# Patient Record
Sex: Female | Born: 1985 | Race: Black or African American | Hispanic: No | Marital: Single | Smoking: Never smoker
Health system: Southern US, Community
[De-identification: ages and names within clinical notes are randomized; demographics above are authoritative.]

---

## 2017-01-30 ENCOUNTER — Encounter (HOSPITAL_BASED_OUTPATIENT_CLINIC_OR_DEPARTMENT_OTHER): Payer: Self-pay | Admitting: Emergency Medicine

## 2017-01-30 ENCOUNTER — Emergency Department (HOSPITAL_BASED_OUTPATIENT_CLINIC_OR_DEPARTMENT_OTHER)
Admission: EM | Admit: 2017-01-30 | Discharge: 2017-01-30 | Disposition: A | Discharge status: Home / Self Care | Payer: 59 | Attending: Emergency Medicine | Admitting: Emergency Medicine

## 2017-01-30 DIAGNOSIS — H9201 Otalgia, right ear: Secondary | ICD-10-CM | POA: Diagnosis not present

## 2017-01-30 DIAGNOSIS — J36 Peritonsillar abscess: Secondary | ICD-10-CM | POA: Diagnosis not present

## 2017-01-30 DIAGNOSIS — R070 Pain in throat: Secondary | ICD-10-CM | POA: Diagnosis present

## 2017-01-30 DIAGNOSIS — J029 Acute pharyngitis, unspecified: Secondary | ICD-10-CM | POA: Diagnosis not present

## 2017-01-30 DIAGNOSIS — J028 Acute pharyngitis due to other specified organisms: Secondary | ICD-10-CM

## 2017-01-30 LAB — RAPID STREP SCREEN (MED CTR MEBANE ONLY): STREPTOCOCCUS, GROUP A SCREEN (DIRECT): POSITIVE — AB

## 2017-01-30 MED ORDER — IBUPROFEN 400 MG PO TABS
400.0000 mg | ORAL_TABLET | Freq: Once | ORAL | Status: AC
  Administered 2017-01-30: 400 mg via ORAL
  Filled 2017-01-30: qty 1

## 2017-01-30 MED ORDER — AMOXICILLIN 500 MG PO CAPS: 1000.0000 mg | ORAL_CAPSULE | Freq: Three times a day (TID) | ORAL | 0 refills | Status: DC

## 2017-01-30 NOTE — ED Provider Notes (Signed)
[facility] EMERGENCY DEPARTMENT Provider Note   CSN: [id number] Arrival date & time: 01/30/17  0751     History   Chief Complaint Chief Complaint  Patient presents with  . Sore Throat  . Otalgia    HPI Jenna Hickman is a 32 y.o. female.  Patient c/o throat pain for the past week. Gradual onset, constant, moderate, slowly worsening. No trouble breathing or swallowing. Pain is worse on right, and w associated right ear pain. No decreased hearing. No drainage from ear. No headache. No neck pain or stiffness. Subjective fever. No runny nose. No cough or chest pain. No known ill contacts.    The history is provided by the patient.  Sore Throat  Pertinent negatives include no headaches and no shortness of breath.  Otalgia  Associated symptoms include sore throat. Pertinent negatives include no headaches, no rhinorrhea, no diarrhea, no vomiting, no neck pain and no rash.    History reviewed. No pertinent past medical history.  There are no active problems to display for this patient.   History reviewed. No pertinent surgical history.  OB History    No data available       Home Medications    Prior to Admission medications   Medication Sig Start Date End Date Taking? Authorizing Provider  amoxicillin (AMOXIL) 500 MG capsule Take 2 capsules (1,000 mg total) by mouth 3 (three) times daily. 01/30/17   Cathren LaineSteinl, Kellene Mccleary, MD    Family History No family history on file.  Social History Social History   Tobacco Use  . Smoking status: Not on file  Substance Use Topics  . Alcohol use: Not on file  . Drug use: Not on file     Allergies   No known allergies   Review of Systems Review of Systems  Constitutional: Positive for fever.  HENT: Positive for ear pain and sore throat. Negative for rhinorrhea.   Respiratory: Negative for shortness of breath.   Gastrointestinal: Negative for diarrhea and vomiting.  Musculoskeletal: Negative for neck pain and neck  stiffness.  Skin: Negative for rash.  Neurological: Negative for headaches.     Physical Exam Updated Vital Signs BP 127/89   Pulse 95   Temp 99.2 F (37.3 C) (Oral)   Resp 20   Ht 1.727 m (5\' 8" )   Wt 108.9 kg (240 lb)   SpO2 99%   BMI 36.49 kg/m   Physical Exam  Constitutional: She appears well-developed and well-nourished. No distress.  HENT:  Pharynx erythematous. Mild increased swelling on right. +exudate. No swelling or pain to floor of mouth or neck.   Eyes: Conjunctivae are normal. No scleral icterus.  Neck: Neck supple. No tracheal deviation present.  Anterior cervical l/a. No stiffness or rigidity  Cardiovascular: Normal rate, regular rhythm, normal heart sounds and intact distal pulses.  No murmur heard. Pulmonary/Chest: Effort normal. No stridor. No respiratory distress.  Abdominal: Normal appearance. She exhibits no distension.  No hsm.   Musculoskeletal: She exhibits no edema.  Lymphadenopathy:    She has cervical adenopathy.  Neurological: She is alert.  Skin: Skin is warm and dry. No rash noted. She is not diaphoretic.  Psychiatric: She has a normal mood and affect.  Nursing note and vitals reviewed.    ED Treatments / Results  Labs (all labs ordered are listed, but only abnormal results are displayed) Labs Reviewed  RAPID STREP SCREEN (NOT AT [facility]) - Abnormal; Notable for the following components:      Result Value  Streptococcus, Group A Screen (Direct) POSITIVE (*)    All other components within normal limits    EKG  EKG Interpretation None       Radiology No results found.  Procedures Procedures (including critical care time)  Medications Ordered in ED Medications  ibuprofen (ADVIL,MOTRIN) tablet 400 mg (not administered)     Initial Impression / Assessment and Plan / ED Course  I have reviewed the triage vital signs and the nursing notes.  Pertinent labs & imaging results that were available during my care of the patient  were reviewed by me and considered in my medical decision making (see chart for details).  Confirmed nkda w pt.   rx for home.  Instructed if increased right side pain/swelling despite abx rx, to follow up closely with ENT (?possible v early right pta)  Return precautions provided.     Final Clinical Impressions(s) / ED Diagnoses   Final diagnoses:  Pharyngitis due to other organism    ED Discharge Orders        Ordered    amoxicillin (AMOXIL) 500 MG capsule  3 times daily     01/30/17 9604       Cathren Laine, MD 01/30/17 [phone]

## 2017-01-30 NOTE — ED Triage Notes (Signed)
Pt states she had a cold and got over that but her throat has been hurting for 1.5 weeks and then her right ear started hurting approx 3 days ago. No recent fevers. Pt is able to swallow but just hurts

## 2017-01-30 NOTE — Discharge Instructions (Signed)
It was our pleasure to provide your ER care today - we hope that you feel better.  Take antibiotic as prescribed.  Take acetaminophen and/or ibuprofen as need for pain.  Use throat lozenges as need for symptom relief.  Follow up with ENT doctor in the next couple days if worsening right sided throat pain and swelling - see referral - call office to arrange appointment.   Return to ER if worse, unable to swallow, trouble breathing, other concern.

## 2017-01-30 NOTE — ED Notes (Signed)
Pt understood dc material. NAD noted. Script and work excuse given at dc 

## 2017-02-20 ENCOUNTER — Emergency Department (HOSPITAL_BASED_OUTPATIENT_CLINIC_OR_DEPARTMENT_OTHER)
Admission: EM | Admit: 2017-02-20 | Discharge: 2017-02-20 | Disposition: A | Discharge status: Home / Self Care | Payer: 59 | Attending: Emergency Medicine | Admitting: Emergency Medicine

## 2017-02-20 ENCOUNTER — Encounter (HOSPITAL_BASED_OUTPATIENT_CLINIC_OR_DEPARTMENT_OTHER): Payer: Self-pay | Admitting: *Deleted

## 2017-02-20 ENCOUNTER — Other Ambulatory Visit: Payer: Self-pay

## 2017-02-20 DIAGNOSIS — J111 Influenza due to unidentified influenza virus with other respiratory manifestations: Secondary | ICD-10-CM | POA: Insufficient documentation

## 2017-02-20 DIAGNOSIS — R509 Fever, unspecified: Secondary | ICD-10-CM | POA: Diagnosis present

## 2017-02-20 LAB — RAPID STREP SCREEN (MED CTR MEBANE ONLY): STREPTOCOCCUS, GROUP A SCREEN (DIRECT): NEGATIVE

## 2017-02-20 MED ORDER — OSELTAMIVIR PHOSPHATE 75 MG PO CAPS
75.0000 mg | ORAL_CAPSULE | Freq: Once | ORAL | Status: AC
  Administered 2017-02-20: 75 mg via ORAL
  Filled 2017-02-20: qty 1

## 2017-02-20 MED ORDER — OSELTAMIVIR PHOSPHATE 75 MG PO CAPS: 75.0000 mg | ORAL_CAPSULE | Freq: Two times a day (BID) | ORAL | 0 refills | Status: DC

## 2017-02-20 MED ORDER — ACETAMINOPHEN 500 MG PO TABS
1000.0000 mg | ORAL_TABLET | Freq: Once | ORAL | Status: AC
Start: 2017-02-20 — End: 2017-02-20
  Administered 2017-02-20: 1000 mg via ORAL
  Filled 2017-02-20: qty 2

## 2017-02-20 NOTE — ED Provider Notes (Signed)
[facility] EMERGENCY DEPARTMENT Provider Note   CSN: [id number] Arrival date & time: 02/20/17  1839     History   Chief Complaint Chief Complaint  Patient presents with  . Fever    HPI Jenna Hickman is a 32 y.o. female.  The history is provided by the patient.  Influenza  Presenting symptoms: cough, fatigue, fever and myalgias   Presenting symptoms: no nausea, no shortness of breath, no sore throat and no vomiting   Severity:  Severe Onset quality:  Gradual Duration:  1 day Progression:  Worsening Chronicity:  New Relieved by:  Nothing Worsened by:  Nothing Ineffective treatments:  None tried Associated symptoms: chills, decreased appetite and nasal congestion   Associated symptoms: no neck stiffness   Risk factors: sick contacts   Risk factors comment:  Works in [employer] and multiple kids out with flu   History reviewed. No pertinent past medical history.  There are no active problems to display for this patient.   History reviewed. No pertinent surgical history.  OB History    No data available       Home Medications    Prior to Admission medications   Medication Sig Start Date End Date Taking? Authorizing Provider  amoxicillin (AMOXIL) 500 MG capsule Take 2 capsules (1,000 mg total) by mouth 3 (three) times daily. 01/30/17   Cathren Laine, MD    Family History No family history on file.  Social History Social History   Tobacco Use  . Smoking status: Never Smoker  . Smokeless tobacco: Never Used  Substance Use Topics  . Alcohol use: Not on file  . Drug use: Not on file     Allergies   No known allergies   Review of Systems Review of Systems  Constitutional: Positive for chills, decreased appetite, fatigue and fever.  HENT: Positive for congestion. Negative for sore throat.   Respiratory: Positive for cough. Negative for shortness of breath.   Gastrointestinal: Negative for nausea and vomiting.  Musculoskeletal: Positive  for myalgias. Negative for neck stiffness.  All other systems reviewed and are negative.    Physical Exam Updated Vital Signs BP 140/83 (BP Location: Right Arm)   Pulse (!) 118   Resp 20   Ht 5\' 8"  (1.727 m)   Wt 108.9 kg (240 lb)   LMP 02/01/2017   SpO2 96%   BMI 36.49 kg/m   Physical Exam  Constitutional: She is oriented to person, place, and time. She appears well-developed and well-nourished. No distress.  HENT:  Head: Normocephalic and atraumatic.  Right Ear: A middle ear effusion is present.  Left Ear: A middle ear effusion is present.  Nose: Mucosal edema and rhinorrhea present.  Mouth/Throat: Posterior oropharyngeal erythema present. No oropharyngeal exudate or posterior oropharyngeal edema.  Eyes: Conjunctivae and EOM are normal. Pupils are equal, round, and reactive to light.  Neck: Normal range of motion. Neck supple.  Cardiovascular: Regular rhythm and intact distal pulses. Tachycardia present.  No murmur heard. Pulmonary/Chest: Effort normal and breath sounds normal. No respiratory distress. She has no wheezes. She has no rales.  Abdominal: Soft. She exhibits no distension. There is no tenderness. There is no rebound and no guarding.  Musculoskeletal: Normal range of motion. She exhibits no edema or tenderness.  Neurological: She is alert and oriented to person, place, and time.  Skin: Skin is warm and dry. No rash noted. No erythema.  Psychiatric: She has a normal mood and affect. Her behavior is normal.  Nursing note  and vitals reviewed.    ED Treatments / Results  Labs (all labs ordered are listed, but only abnormal results are displayed) Labs Reviewed  RAPID STREP SCREEN (NOT AT [facility])  CULTURE, GROUP A STREP [facility]    EKG  EKG Interpretation None       Radiology No results found.  Procedures Procedures (including critical care time)  Medications Ordered in ED Medications  acetaminophen (TYLENOL) tablet 1,000 mg (not administered)    oseltamivir (TAMIFLU) capsule 75 mg (not administered)     Initial Impression / Assessment and Plan / ED Course  I have reviewed the triage vital signs and the nursing notes.  Pertinent labs & imaging results that were available during my care of the patient were reviewed by me and considered in my medical decision making (see chart for details).     Pt with symptoms consistent with influenza.  Normal exam here but is febrile.  No signs of breathing difficulty  No signs of strep pharyngitis, otitis or abnormal abdominal findings.   rapid strep wnl.  Will continue antipyretica, tamiflu and rest and fluids and return for any further problems.   Final Clinical Impressions(s) / ED Diagnoses   Final diagnoses:  Influenza    ED Discharge Orders        Ordered    oseltamivir (TAMIFLU) 75 MG capsule  Every 12 hours     02/20/17 2029       Gwyneth SproutPlunkett, Zayne Marovich, MD 02/20/17 2032

## 2017-02-20 NOTE — ED Triage Notes (Addendum)
Fever since yesterday. States she can control the fever with Tylenol and Motrin. She has not taken Motrin since this am. Body aches. She was treated for a positive strep screen 3 weeks ago.

## 2017-02-23 LAB — CULTURE, GROUP A STREP (THRC)

## 2018-04-07 DIAGNOSIS — Z803 Family history of malignant neoplasm of breast: Secondary | ICD-10-CM | POA: Insufficient documentation

## 2018-04-07 DIAGNOSIS — Z86711 Personal history of pulmonary embolism: Secondary | ICD-10-CM | POA: Insufficient documentation

## 2018-07-08 DIAGNOSIS — Z86718 Personal history of other venous thrombosis and embolism: Secondary | ICD-10-CM | POA: Insufficient documentation

## 2018-07-08 DIAGNOSIS — E6609 Other obesity due to excess calories: Secondary | ICD-10-CM | POA: Insufficient documentation

## 2020-04-12 ENCOUNTER — Encounter (HOSPITAL_BASED_OUTPATIENT_CLINIC_OR_DEPARTMENT_OTHER): Payer: Self-pay

## 2020-04-12 ENCOUNTER — Emergency Department (HOSPITAL_BASED_OUTPATIENT_CLINIC_OR_DEPARTMENT_OTHER)
Admission: EM | Admit: 2020-04-12 | Discharge: 2020-04-12 | Disposition: A | Discharge status: Home / Self Care | Payer: Self-pay | Attending: Emergency Medicine | Admitting: Emergency Medicine

## 2020-04-12 ENCOUNTER — Other Ambulatory Visit: Payer: Self-pay

## 2020-04-12 ENCOUNTER — Emergency Department (HOSPITAL_BASED_OUTPATIENT_CLINIC_OR_DEPARTMENT_OTHER): Payer: Self-pay

## 2020-04-12 DIAGNOSIS — K219 Gastro-esophageal reflux disease without esophagitis: Secondary | ICD-10-CM | POA: Insufficient documentation

## 2020-04-12 LAB — BASIC METABOLIC PANEL
Anion gap: 8 (ref 5–15)
BUN: 9 mg/dL (ref 6–20)
CO2: 26 mmol/L (ref 22–32)
Calcium: 8.9 mg/dL (ref 8.9–10.3)
Chloride: 102 mmol/L (ref 98–111)
Creatinine, Ser: 0.87 mg/dL (ref 0.44–1.00)
GFR, Estimated: >60 mL/min (ref >60)
Glucose, Bld: 93 mg/dL (ref 70–99)
Potassium: 3.8 mmol/L (ref 3.5–5.1)
Sodium: 136 mmol/L (ref 135–145)

## 2020-04-12 LAB — CBC
HCT: 39.8 % (ref 36.0–46.0)
Hemoglobin: 13.2 g/dL (ref 12.0–15.0)
MCH: 29.5 pg (ref 26.0–34.0)
MCHC: 33.2 g/dL (ref 30.0–36.0)
MCV: 89 fL (ref 80.0–100.0)
Platelets: 278 10*3/uL (ref 150–400)
RBC: 4.47 MIL/uL (ref 3.87–5.11)
RDW: 12.9 % (ref 11.5–15.5)
WBC: 5.1 10*3/uL (ref 4.0–10.5)
nRBC: 0 % (ref 0.0–0.2)

## 2020-04-12 LAB — TROPONIN I (HIGH SENSITIVITY): Troponin I (High Sensitivity): 3 ng/L (ref <18)

## 2020-04-12 LAB — PREGNANCY, URINE: Preg Test, Ur: NEGATIVE

## 2020-04-12 IMAGING — DX DG CHEST 2V
2 series · 2 of 2 positions shown · non-contrast
Comparison: Report from chest CT [DATE], images not available.

CLINICAL DATA: Chest pain for 4 days.

EXAM:
CHEST - 2 VIEW

[chest pa]
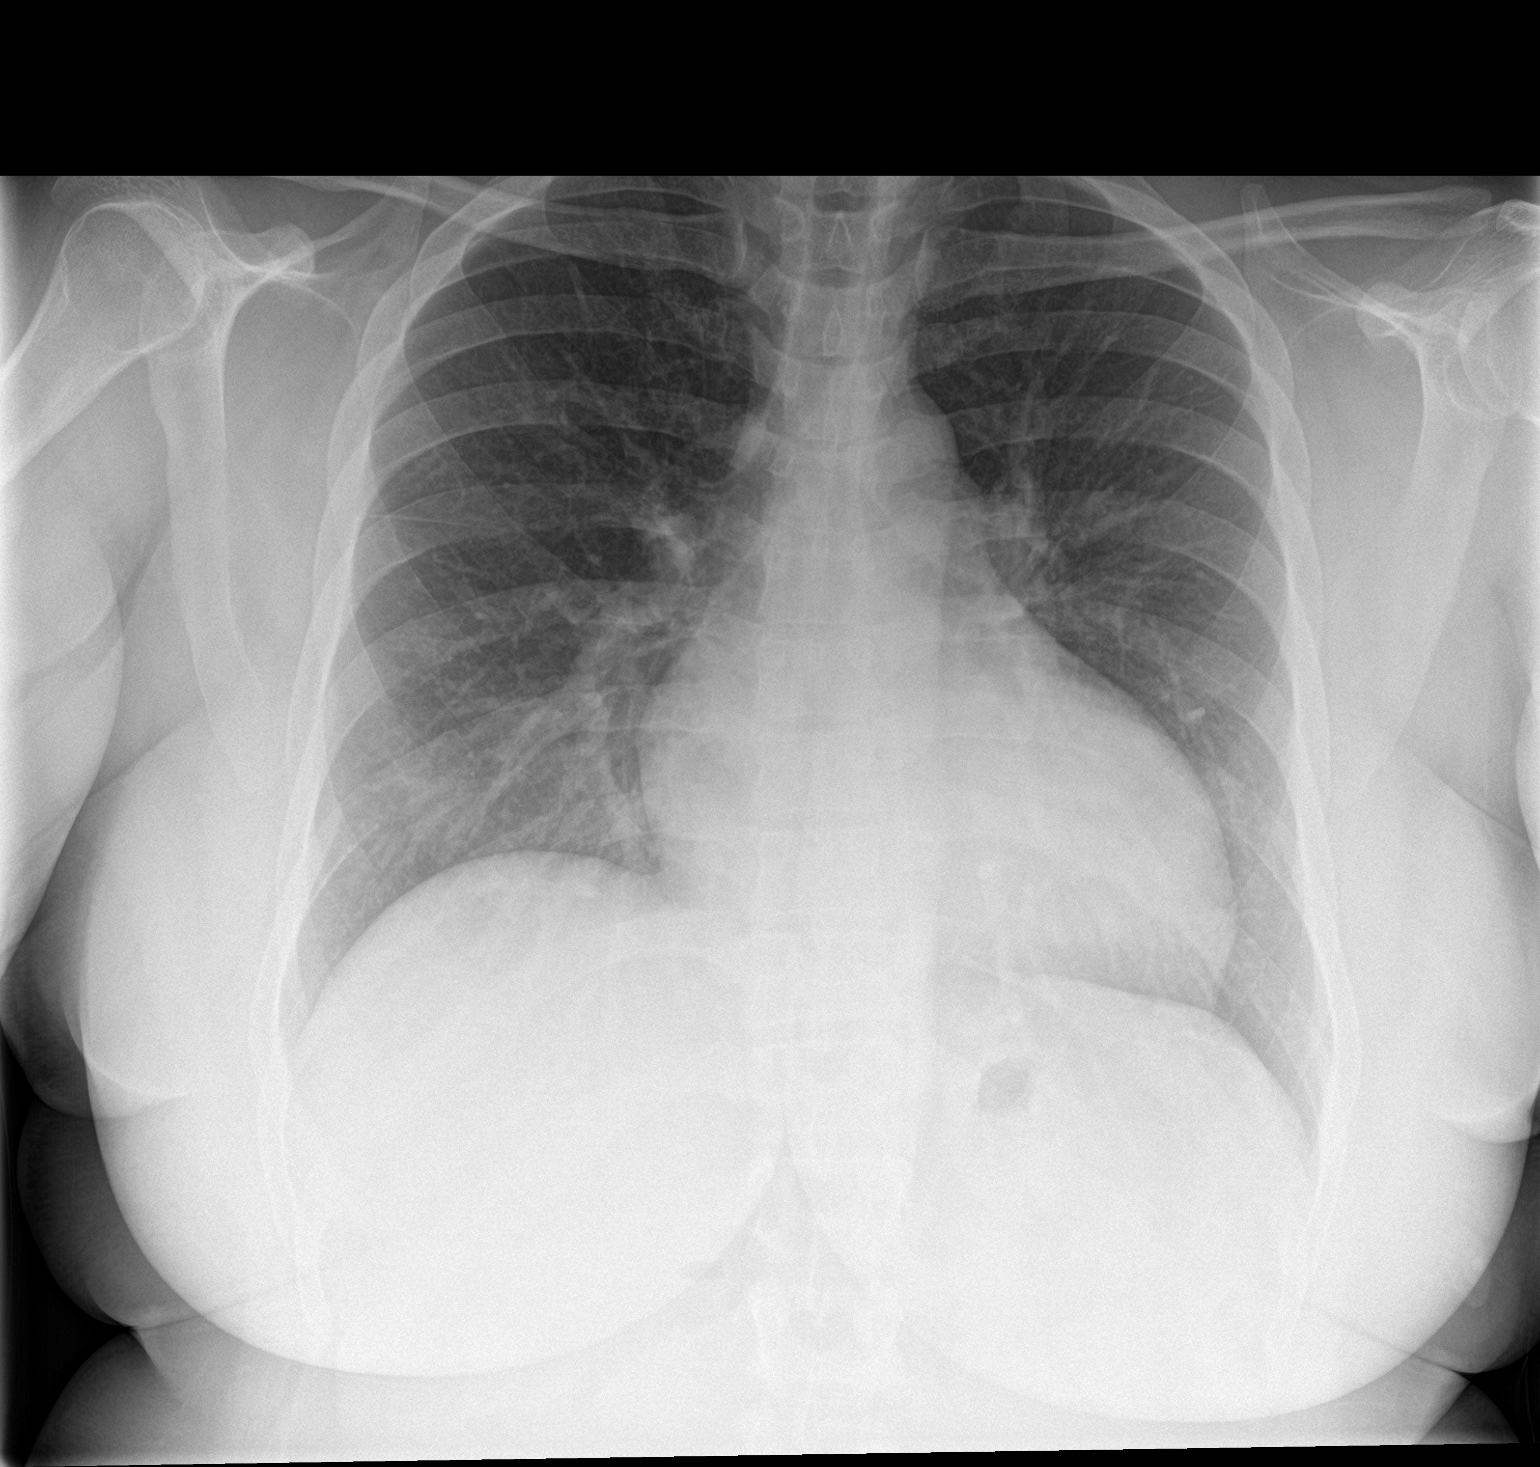

[chest lat]
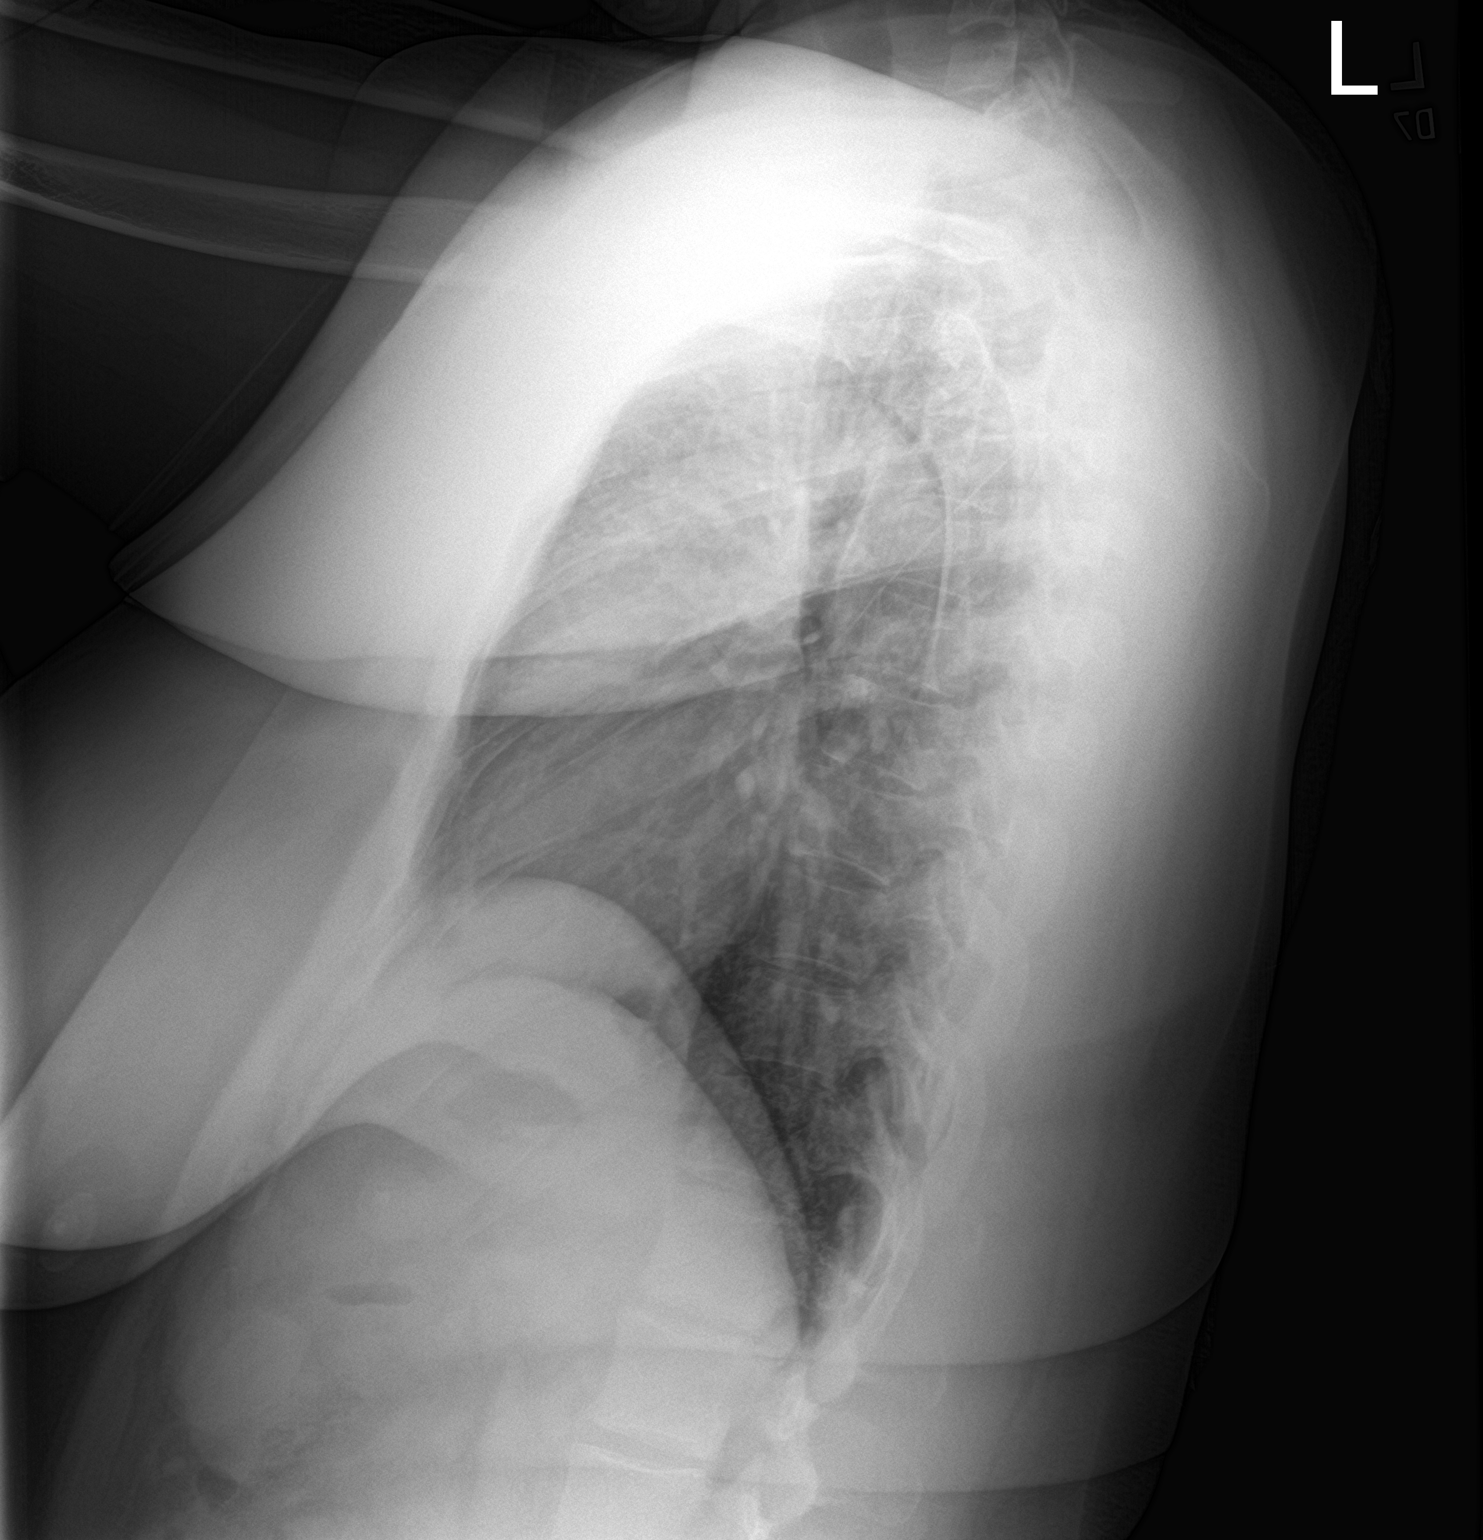

[2 of 2 positions shown; findings below may reference images not displayed]

FINDINGS: The cardiomediastinal contours are normal. The lungs are clear.
Pulmonary vasculature is normal. No consolidation, pleural effusion,
or pneumothorax. No acute osseous abnormalities are seen.
IMPRESSION: Negative radiographs of the chest.

## 2020-04-12 MED ORDER — FAMOTIDINE IN NACL 20-0.9 MG/50ML-% IV SOLN
20.0000 mg | Freq: Once | INTRAVENOUS | Status: AC
  Administered 2020-04-12: 20 mg via INTRAVENOUS
  Filled 2020-04-12: qty 50

## 2020-04-12 MED ORDER — ALUM & MAG HYDROXIDE-SIMETH 200-200-20 MG/5ML PO SUSP
30.0000 mL | Freq: Once | ORAL | Status: AC
Start: 2020-04-12 — End: 2020-04-12
  Administered 2020-04-12: 30 mL via ORAL
  Filled 2020-04-12: qty 30

## 2020-04-12 MED ORDER — OMEPRAZOLE 20 MG PO CPDR: 20.0000 mg | DELAYED_RELEASE_CAPSULE | Freq: Every day | ORAL | 0 refills | Status: AC

## 2020-04-12 NOTE — ED Notes (Signed)
ED Provider at bedside. 

## 2020-04-12 NOTE — ED Triage Notes (Signed)
Pt c/o intermittent CP x 4 days-denies fever/flu sx-NAD-steady gait 

## 2020-04-12 NOTE — ED Provider Notes (Signed)
[facility] EMERGENCY DEPARTMENT Provider Note   CSN: [id number] Arrival date & time: 04/12/20  1707     History Chief Complaint  Patient presents with  . Chest Pain    Jenna Hickman is a 35 y.o. female with past medical history that presents the emerge department today for 4 days of chest pain.  Patient states that chest pain is intermittent, primarily only at night.  Patient states that is in the center of her chest and feels as if it is a dull sensation, states that this began suddenly after eating hot wings.  Normally does not eat spicy food.  Patient states that it does feel like a burning sensation at times.  States that she was slightly concerned today because she felt as if her left arm was warm.  Pain does not radiate anywhere, states that pain is currently dull and has been consistent today, however worse last night.  Denies any cardiac history.  Denies any chance of pregnancy.  Denies any numbness or tingling.  No shortness of breath.  Denies any nausea or vomiting.  Never been diagnosed with acid reflux.  Denies any fevers or chills.  Denies any cough or URI symptoms.  Denies any rib pain.  Patient states that she did have a DVT 2 years ago, was on Eliquis but stopped this.  Chest pain is not pleuritic.  Denies any palpitations, leg swelling, weight gain.  No other symptoms.  HPI     History reviewed. No pertinent past medical history.  There are no problems to display for this patient.   History reviewed. No pertinent surgical history.   OB History   No obstetric history on file.     No family history on file.  Social History   Tobacco Use  . Smoking status: Never Smoker  . Smokeless tobacco: Never Used  Vaping Use  . Vaping Use: Never used  Substance Use Topics  . Alcohol use: Never  . Drug use: Never    Home Medications Prior to Admission medications   Medication Sig Start Date End Date Taking? Authorizing Provider  omeprazole (PRILOSEC)  20 MG capsule Take 1 capsule (20 mg total) by mouth daily. 04/12/20  Yes Farrel Gordon, PA-C  amoxicillin (AMOXIL) 500 MG capsule Take 2 capsules (1,000 mg total) by mouth 3 (three) times daily. 01/30/17   Cathren Laine, MD  oseltamivir (TAMIFLU) 75 MG capsule Take 1 capsule (75 mg total) by mouth every 12 (twelve) hours. 02/20/17   Gwyneth Sprout, MD    Allergies    No known allergies  Review of Systems   Review of Systems  Constitutional: Negative for chills, diaphoresis, fatigue and fever.  HENT: Negative for congestion, sore throat and trouble swallowing.   Eyes: Negative for pain and visual disturbance.  Respiratory: Negative for cough, shortness of breath and wheezing.   Cardiovascular: Positive for chest pain. Negative for palpitations and leg swelling.  Gastrointestinal: Negative for abdominal distention, abdominal pain, diarrhea, nausea and vomiting.  Genitourinary: Negative for difficulty urinating.  Musculoskeletal: Negative for back pain, neck pain and neck stiffness.  Skin: Negative for pallor.  Neurological: Negative for dizziness, speech difficulty, weakness and headaches.  Psychiatric/Behavioral: Negative for confusion.    Physical Exam Updated Vital Signs BP (!) 156/83   Pulse 84   Temp 99.2 F (37.3 C) (Oral)   Resp 15   Ht 5\' 8"  (1.727 m)   Wt 113.9 kg   SpO2 100%   BMI 38.18 kg/m  Physical Exam Constitutional:      General: She is not in acute distress.    Appearance: Normal appearance. She is not ill-appearing, toxic-appearing or diaphoretic.  HENT:     Mouth/Throat:     Mouth: Mucous membranes are moist.     Pharynx: Oropharynx is clear.  Eyes:     General: No scleral icterus.    Extraocular Movements: Extraocular movements intact.     Pupils: Pupils are equal, round, and reactive to light.  Cardiovascular:     Rate and Rhythm: Normal rate and regular rhythm.     Pulses: Normal pulses.     Heart sounds: Normal heart sounds.  Pulmonary:      Effort: Pulmonary effort is normal. No respiratory distress.     Breath sounds: Normal breath sounds. No stridor. No wheezing, rhonchi or rales.  Chest:     Chest wall: No tenderness.  Abdominal:     General: Abdomen is flat. There is no distension.     Palpations: Abdomen is soft.     Tenderness: There is no abdominal tenderness. There is no guarding or rebound.  Musculoskeletal:        General: No swelling or tenderness. Normal range of motion.     Cervical back: Normal range of motion and neck supple. No rigidity.     Right lower leg: No edema.     Left lower leg: No edema.  Skin:    General: Skin is warm and dry.     Capillary Refill: Capillary refill takes less than 2 seconds.     Coloration: Skin is not pale.  Neurological:     General: No focal deficit present.     Mental Status: She is alert and oriented to person, place, and time.  Psychiatric:        Mood and Affect: Mood normal.        Behavior: Behavior normal.     ED Results / Procedures / Treatments   Labs (all labs ordered are listed, but only abnormal results are displayed) Labs Reviewed  BASIC METABOLIC PANEL  CBC  PREGNANCY, URINE  TROPONIN I (HIGH SENSITIVITY)    EKG None  Radiology DG Chest 2 View  Result Date: 04/12/2020 CLINICAL DATA:  Chest pain for 4 days. EXAM: CHEST - 2 VIEW COMPARISON:  Report from chest CT 04/03/2018, images not available. FINDINGS: The cardiomediastinal contours are normal. The lungs are clear. Pulmonary vasculature is normal. No consolidation, pleural effusion, or pneumothorax. No acute osseous abnormalities are seen. IMPRESSION: Negative radiographs of the chest. Electronically Signed   By: Narda Rutherford M.D.   On: 04/12/2020 17:44    Procedures Procedures   Medications Ordered in ED Medications  alum & mag hydroxide-simeth (MAALOX/MYLANTA) 200-200-20 MG/5ML suspension 30 mL (30 mLs Oral Given 04/12/20 1841)  famotidine (PEPCID) IVPB 20 mg premix ( Intravenous  Stopped 04/12/20 1912)    ED Course  I have reviewed the triage vital signs and the nursing notes.  Pertinent labs & imaging results that were available during my care of the patient were reviewed by me and considered in my medical decision making (see chart for details).    MDM Rules/Calculators/A&P                         Jenna Hickman is a 35 y.o. female with past medical history that presents the emergency department today for 4 days of chest pain.  Started most likely consistent with acid reflux  since this is worse at night, intermittent and patient has a burning sensation.  Will treat with GI cocktail and Pepcid.Unable to rule out PE with [facility], since patient does have previous DVT.  Patient is low risk Wells criteria, low suspicion for PE.  Heart score 1.  Work-up today unremarkable CBC BMP and troponin negative.  Unlikely to be atypical ACS, presentation is very consistent with acid reflux.  Upon reevaluation, patient states that symptoms have completely resolved with Pepcid and GI cocktail.  Will discharge home on PPI and have patient follow-up with PCP.  Patient agreeable for plan.  Doubt need for further emergent work up at this time. I explained the diagnosis and have given explicit precautions to return to the ER including for any other new or worsening symptoms. The patient understands and accepts the medical plan as it's been dictated and I have answered their questions. Discharge instructions concerning home care and prescriptions have been given. The patient is STABLE and is discharged to home in good condition.   Final Clinical Impression(s) / ED Diagnoses Final diagnoses:  Gastroesophageal reflux disease without esophagitis    Rx / DC Orders ED Discharge Orders         Ordered    omeprazole (PRILOSEC) 20 MG capsule  Daily        04/12/20 1939           Farrel Gordon, PA-C 04/12/20 Loma Sender, MD 04/12/20 [phone]

## 2020-04-12 NOTE — Discharge Instructions (Addendum)
Your cup today was reassuring, your symptoms are most consistent with acid reflux.  Take the PPI, omeprazole for the next month.  Please follow-up with your PCP in the next couple of days.  I also attached some instructions on food choices for acid reflux and what to avoid.  If you have any worsening concerning symptoms to the emergency department.  Get help right away if: You have sudden pain in your arms, neck, jaw, teeth, or back. You suddenly feel sweaty, dizzy, or light-headed. You have chest pain or shortness of breath. You vomit and the vomit is green, yellow, or black, or it looks like blood or coffee grounds. You faint. Your poop (stool) is red, bloody, or black. You cannot swallow, drink, or eat.

## 2020-08-05 ENCOUNTER — Emergency Department (HOSPITAL_BASED_OUTPATIENT_CLINIC_OR_DEPARTMENT_OTHER)
Admission: EM | Admit: 2020-08-05 | Discharge: 2020-08-06 | Disposition: A | Discharge status: Home / Self Care | Payer: Self-pay | Attending: Emergency Medicine | Admitting: Emergency Medicine

## 2020-08-05 ENCOUNTER — Other Ambulatory Visit: Payer: Self-pay

## 2020-08-05 ENCOUNTER — Emergency Department (HOSPITAL_BASED_OUTPATIENT_CLINIC_OR_DEPARTMENT_OTHER): Payer: Self-pay

## 2020-08-05 ENCOUNTER — Encounter (HOSPITAL_BASED_OUTPATIENT_CLINIC_OR_DEPARTMENT_OTHER): Payer: Self-pay | Admitting: Emergency Medicine

## 2020-08-05 DIAGNOSIS — R079 Chest pain, unspecified: Secondary | ICD-10-CM

## 2020-08-05 DIAGNOSIS — R072 Precordial pain: Secondary | ICD-10-CM | POA: Insufficient documentation

## 2020-08-05 DIAGNOSIS — R0789 Other chest pain: Secondary | ICD-10-CM | POA: Insufficient documentation

## 2020-08-05 IMAGING — DX DG CHEST 1V PORT
1 series · 1 of 1 positions shown · non-contrast
Comparison: [DATE]

CLINICAL DATA: Chest pain and burning following eating Mexican food

EXAM:
PORTABLE CHEST 1 VIEW

[chest ap]
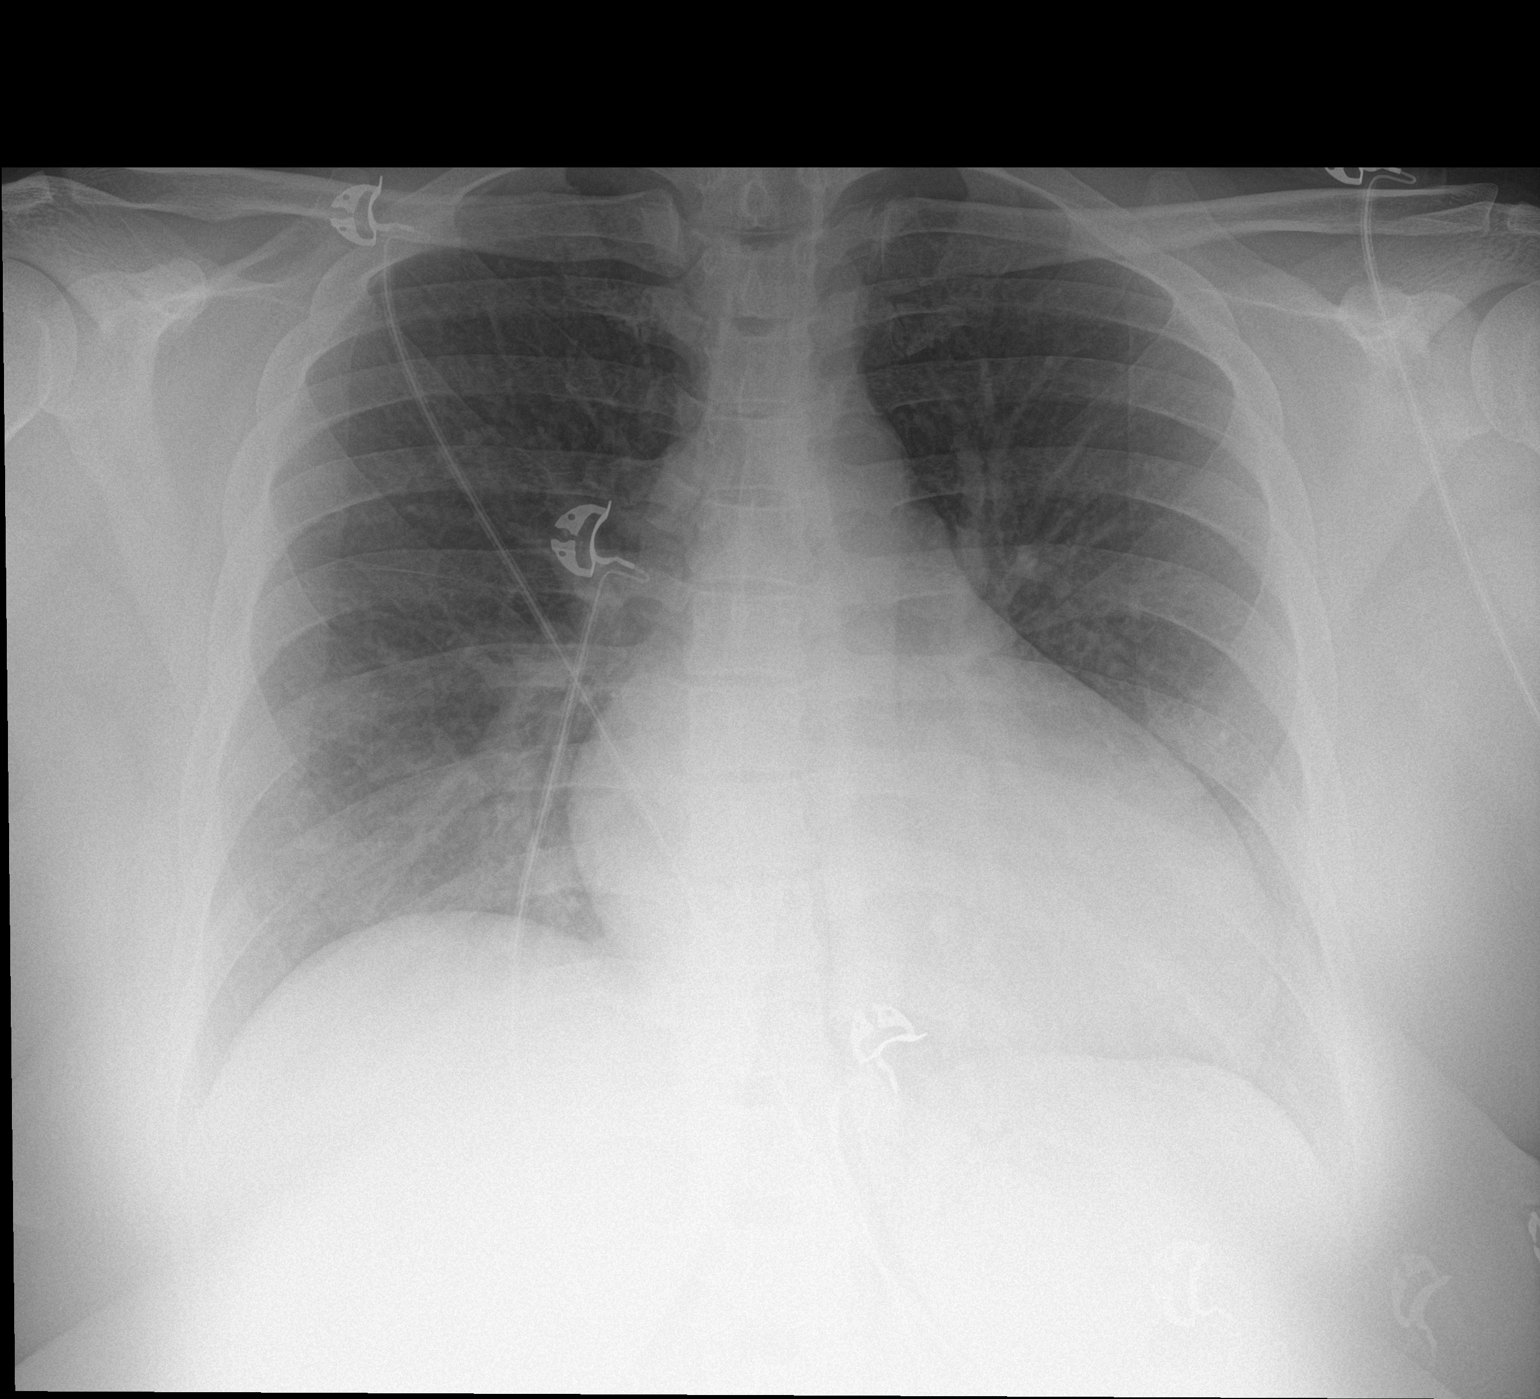

[1 of 1 positions shown; findings below may reference images not displayed]

FINDINGS: Cardiac shadow is enlarged but accentuated by the portable
technique. The lungs are clear bilaterally. No bony abnormality is
seen.
IMPRESSION: No active disease.

## 2020-08-05 MED ORDER — SUCRALFATE 1 GM/10ML PO SUSP
1.0000 g | Freq: Once | ORAL | Status: AC
  Administered 2020-08-05: 1 g via ORAL
  Filled 2020-08-05: qty 10

## 2020-08-05 MED ORDER — FAMOTIDINE 20 MG PO TABS: 20.0000 mg | ORAL_TABLET | Freq: Two times a day (BID) | ORAL | 1 refills | Status: AC

## 2020-08-05 MED ORDER — FAMOTIDINE 20 MG PO TABS
20.0000 mg | ORAL_TABLET | Freq: Once | ORAL | Status: AC
  Administered 2020-08-05: 20 mg via ORAL
  Filled 2020-08-05: qty 1

## 2020-08-05 NOTE — ED Provider Notes (Addendum)
[facility] EMERGENCY DEPARTMENT Provider Note   CSN: [id number] Arrival date & time: 08/05/20  2255     History Chief Complaint  Patient presents with   Chest Pain    Jenna Hickman is a 35 y.o. female.  HPI Patient is a 35 year old female with no pertinent past medical history apart from reflux she states that she was on omeprazole in the past after she was told she may have this during a ER visit for chest pain.  She states that she did this medication helped significantly for symptoms but she ran out of this medication and never got refilled.  She has a primary care provider but is never talked to them about her reflux.  She states that over the past few days she has had episodic chest pain seems to be worse when she lays down and after she eats.  She states it is burning nonradiating sternal associate with some burping.  Denies any nausea or vomiting.  No shortness of breath no radiation of pain.  No exertional or pleuritic symptoms.  No recent surgeries, hospitalization, long travel, hemoptysis, estrogen containing OCP, cancer history.  No unilateral leg swelling.  No history of PE or VTE.   No other significant associated symptoms.  No aggravating mitigating factors performed as discussed above.    History reviewed. No pertinent past medical history.  There are no problems to display for this patient.   History reviewed. No pertinent surgical history.   OB History   No obstetric history on file.     No family history on file.  Social History   Tobacco Use   Smoking status: Never   Smokeless tobacco: Never  Vaping Use   Vaping Use: Never used  Substance Use Topics   Alcohol use: Never   Drug use: Never    Home Medications Prior to Admission medications   Medication Sig Start Date End Date Taking? Authorizing Provider  famotidine (PEPCID) 20 MG tablet Take 1 tablet (20 mg total) by mouth 2 (two) times daily. 08/05/20 10/04/20 Yes Kollin Udell S,  PA  amoxicillin (AMOXIL) 500 MG capsule Take 2 capsules (1,000 mg total) by mouth 3 (three) times daily. 01/30/17   Cathren Laine, MD  omeprazole (PRILOSEC) 20 MG capsule Take 1 capsule (20 mg total) by mouth daily. 04/12/20   Farrel Gordon, PA-C  oseltamivir (TAMIFLU) 75 MG capsule Take 1 capsule (75 mg total) by mouth every 12 (twelve) hours. 02/20/17   Gwyneth Sprout, MD    Allergies    No known allergies  Review of Systems   Review of Systems  Constitutional:  Negative for chills and fever.  HENT:  Negative for congestion.   Eyes:  Negative for pain.  Respiratory:  Negative for cough and shortness of breath.   Cardiovascular:  Positive for chest pain. Negative for leg swelling.  Gastrointestinal:  Negative for abdominal pain and vomiting.  Genitourinary:  Negative for dysuria.  Musculoskeletal:  Negative for myalgias.  Skin:  Negative for rash.  Neurological:  Negative for dizziness and headaches.   Physical Exam Updated Vital Signs BP (!) 148/89   Pulse 65   Temp 98.4 F (36.9 C) (Oral)   Resp 18   Ht 5\' 8"  (1.727 m)   Wt 117.3 kg   LMP 07/13/2020   SpO2 98%   BMI 39.32 kg/m   Physical Exam Vitals and nursing note reviewed.  Constitutional:      General: She is not in acute distress.  Appearance: She is obese.     Comments: Pleasant well-appearing 35 year old.  In no acute distress.  Sitting comfortably in bed.  Able answer questions appropriately follow commands. No increased work of breathing. Speaking in full sentences.   HENT:     Head: Normocephalic and atraumatic.     Nose: Nose normal.  Eyes:     General: No scleral icterus. Neck:     Comments: No JVP Cardiovascular:     Rate and Rhythm: Normal rate and regular rhythm.     Pulses: Normal pulses.     Heart sounds: Normal heart sounds.     Comments: Bilateral radial artery pulses 2+ and symmetric Pulmonary:     Effort: Pulmonary effort is normal. No respiratory distress.     Breath sounds: No  wheezing.  Abdominal:     Palpations: Abdomen is soft.     Tenderness: There is no abdominal tenderness. There is no guarding or rebound.  Musculoskeletal:     Cervical back: Normal range of motion.     Right lower leg: No edema.     Left lower leg: No edema.     Comments: No calf tenderness or unilateral leg swelling no edema.  Skin:    General: Skin is warm and dry.     Capillary Refill: Capillary refill takes less than 2 seconds.  Neurological:     Mental Status: She is alert. Mental status is at baseline.  Psychiatric:        Mood and Affect: Mood normal.        Behavior: Behavior normal.    ED Results / Procedures / Treatments   Labs (all labs ordered are listed, but only abnormal results are displayed) Labs Reviewed - No data to display  EKG None  Radiology DG Chest Portable 1 View  Result Date: 08/05/2020 CLINICAL DATA:  Chest pain and burning following eating [place] food EXAM: PORTABLE CHEST 1 VIEW COMPARISON:  04/12/2020 FINDINGS: Cardiac shadow is enlarged but accentuated by the portable technique. The lungs are clear bilaterally. No bony abnormality is seen. IMPRESSION: No active disease. Electronically Signed   By: Alcide Clever M.D.   On: 08/05/2020 23:25    Procedures Procedures   Medications Ordered in ED Medications  famotidine (PEPCID) tablet 20 mg (20 mg Oral Given 08/05/20 2318)  sucralfate (CARAFATE) 1 GM/10ML suspension 1 g (1 g Oral Given 08/05/20 2318)    ED Course  I have reviewed the triage vital signs and the nursing notes.  Pertinent labs & imaging results that were available during my care of the patient were reviewed by me and considered in my medical decision making (see chart for details).    MDM Rules/Calculators/A&P                          Patient is a 35 year old female presented today with burning sternal chest pain that occurs when she lays down and after she eats.  She has a history of reflux has had cardiac work-ups in the past that  were without abnormality.  She was prescribed omeprazole but stopped taking this after 30 days because she ran out of medication she states that she never got a refill because she has not seen her PCP since that time.  She denies any current symptoms at all denies any chest pain presently.  Denies any shortness of breath nausea or vomiting.  Denies any other associate symptoms.  CXR - reviewed. Agree with rad  read. NAD.  EKG is normal sinus rhythm no significant axis deviation no significant hypertrophy.  No ST-T wave abnormalities of note.  Normal EKG - reviewed by Dr. Judd Lien.   Shared decision-making oversedation patient offered cardiac work-up she states that she believes this is reflux and would like to be treated for this at this time.  Will discharge with Pepcid and will give 1 dose of Pepcid and 1 dose of sucralfate here in ER.  She will follow-up with PCP.  Given strict return precautions.  Final Clinical Impression(s) / ED Diagnoses Final diagnoses:  Chest pain, unspecified type  Atypical chest pain    Rx / DC Orders ED Discharge Orders          Ordered    famotidine (PEPCID) 20 MG tablet  2 times daily        08/05/20 2316             Gailen Shelter, [place] 08/05/20 2340    Solon Augusta Mal[place], [place] 08/05/20 2341    Geoffery Lyons, MD 08/06/20 0111

## 2020-08-05 NOTE — ED Triage Notes (Signed)
Reports central chest burning that started last night after eating [place] food.  Hx of gerd.  Was on medication but ran out.  Also c/o numbness down left arm.

## 2020-08-05 NOTE — Discharge Instructions (Addendum)
Please follow-up with your primary care provider and discuss your symptoms with them. Return to the ER for any new or concerning symptoms.  Please take medication as prescribed I have written 1 refill for this however you will need to follow-up with your primary care provider for additional refills.

## 2020-08-29 DIAGNOSIS — K219 Gastro-esophageal reflux disease without esophagitis: Secondary | ICD-10-CM | POA: Insufficient documentation

## 2020-09-01 ENCOUNTER — Emergency Department (HOSPITAL_BASED_OUTPATIENT_CLINIC_OR_DEPARTMENT_OTHER): Payer: Self-pay

## 2020-09-01 ENCOUNTER — Other Ambulatory Visit: Payer: Self-pay

## 2020-09-01 DIAGNOSIS — R202 Paresthesia of skin: Secondary | ICD-10-CM | POA: Insufficient documentation

## 2020-09-01 DIAGNOSIS — R0789 Other chest pain: Secondary | ICD-10-CM | POA: Insufficient documentation

## 2020-09-01 DIAGNOSIS — Z20822 Contact with and (suspected) exposure to covid-19: Secondary | ICD-10-CM | POA: Insufficient documentation

## 2020-09-01 LAB — TROPONIN I (HIGH SENSITIVITY): Troponin I (High Sensitivity): 2 ng/L (ref <18)

## 2020-09-01 LAB — CBC
HCT: 38 % (ref 36.0–46.0)
Hemoglobin: 12.6 g/dL (ref 12.0–15.0)
MCH: 29.4 pg (ref 26.0–34.0)
MCHC: 33.2 g/dL (ref 30.0–36.0)
MCV: 88.6 fL (ref 80.0–100.0)
Platelets: 248 10*3/uL (ref 150–400)
RBC: 4.29 MIL/uL (ref 3.87–5.11)
RDW: 12.6 % (ref 11.5–15.5)
WBC: 5.4 10*3/uL (ref 4.0–10.5)
nRBC: 0 % (ref 0.0–0.2)

## 2020-09-01 LAB — BASIC METABOLIC PANEL
Anion gap: 4 — ABNORMAL LOW (ref 5–15)
BUN: 11 mg/dL (ref 6–20)
CO2: 27 mmol/L (ref 22–32)
Calcium: 9.2 mg/dL (ref 8.9–10.3)
Chloride: 105 mmol/L (ref 98–111)
Creatinine, Ser: 0.84 mg/dL (ref 0.44–1.00)
GFR, Estimated: >60 mL/min (ref >60)
Glucose, Bld: 107 mg/dL — ABNORMAL HIGH (ref 70–99)
Potassium: 4 mmol/L (ref 3.5–5.1)
Sodium: 136 mmol/L (ref 135–145)

## 2020-09-01 IMAGING — CR DG CHEST 2V
2 series · 2 of 2 positions shown · non-contrast
Comparison: [DATE]

CLINICAL DATA: Chest pain

EXAM:
CHEST - 2 VIEW

[w chest pa]
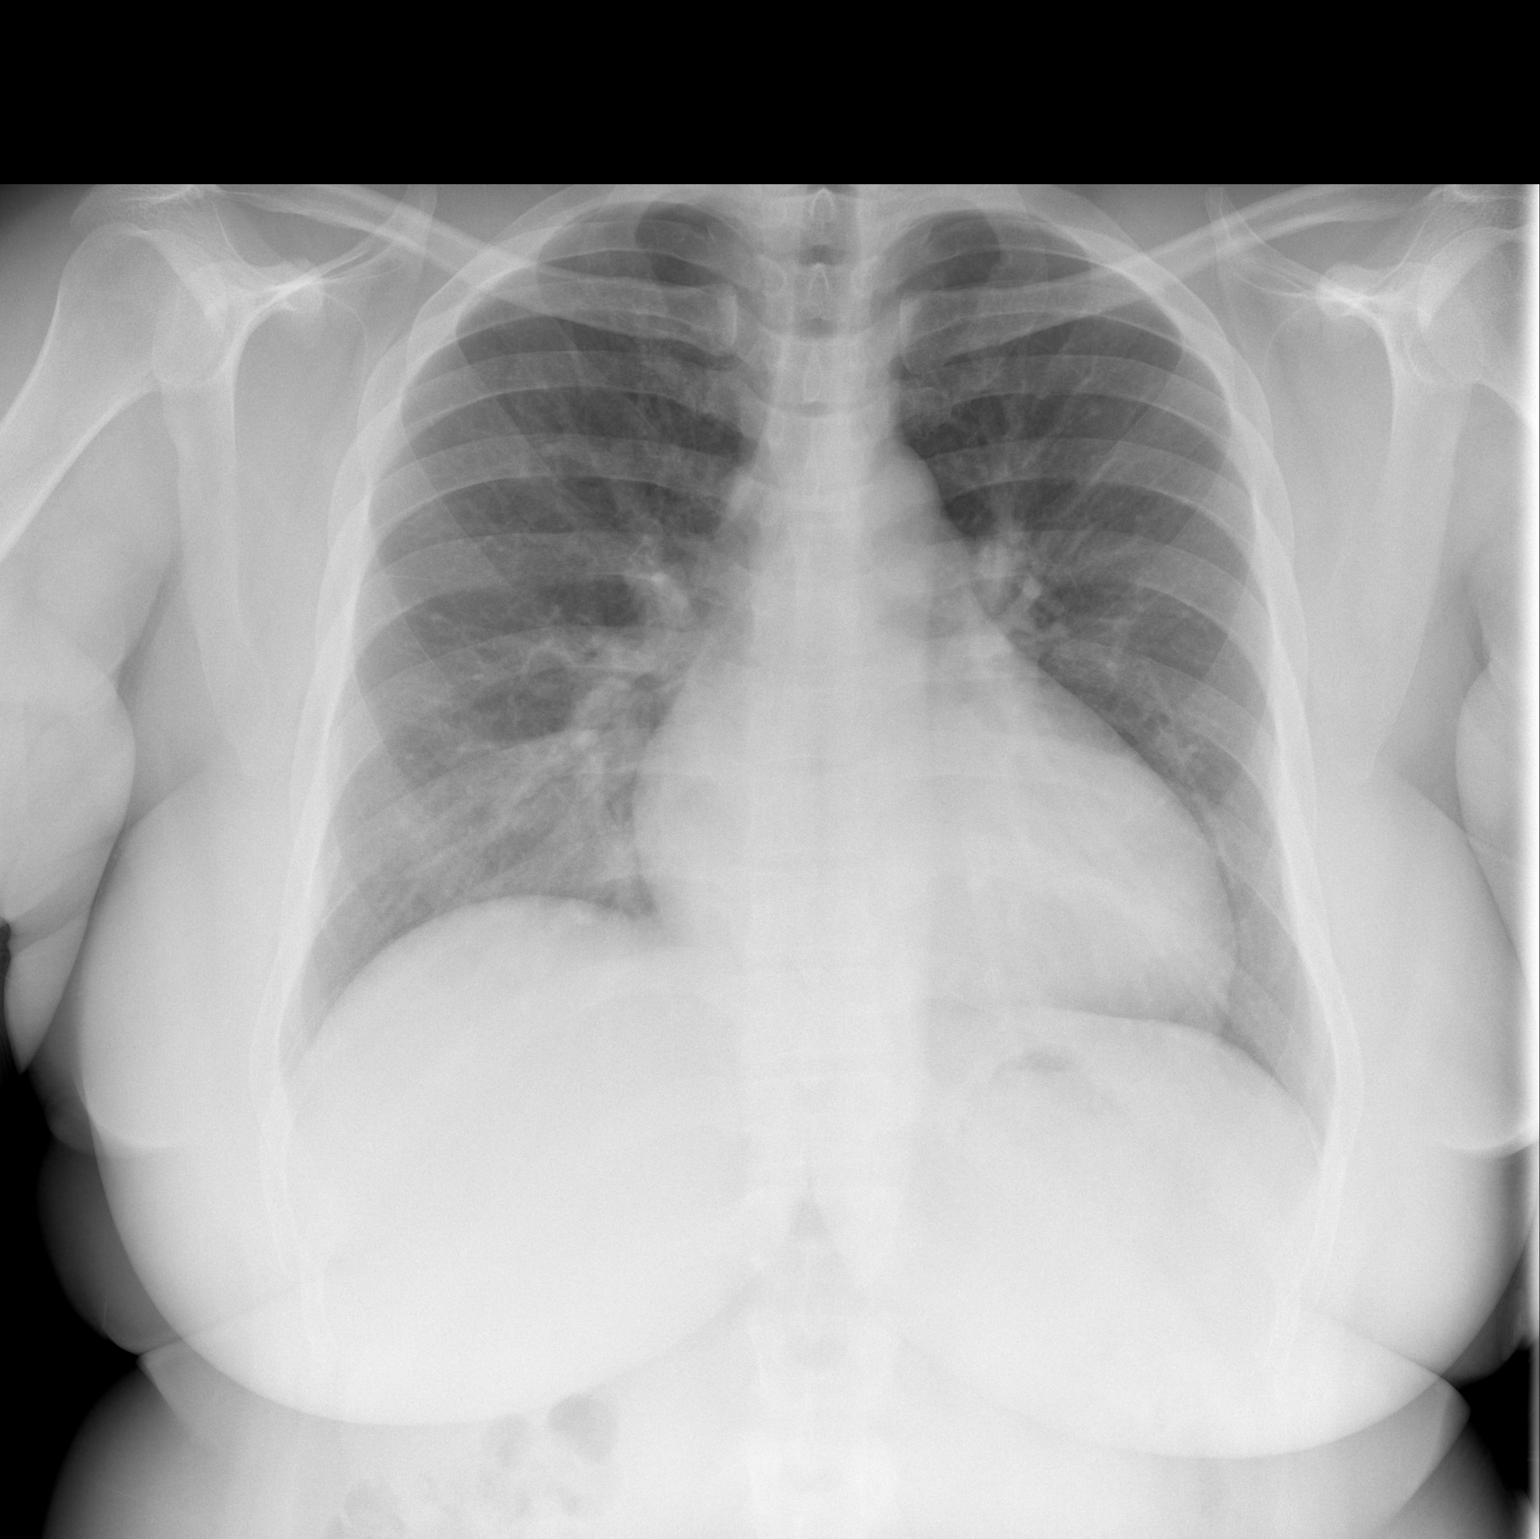

[w chest lat]
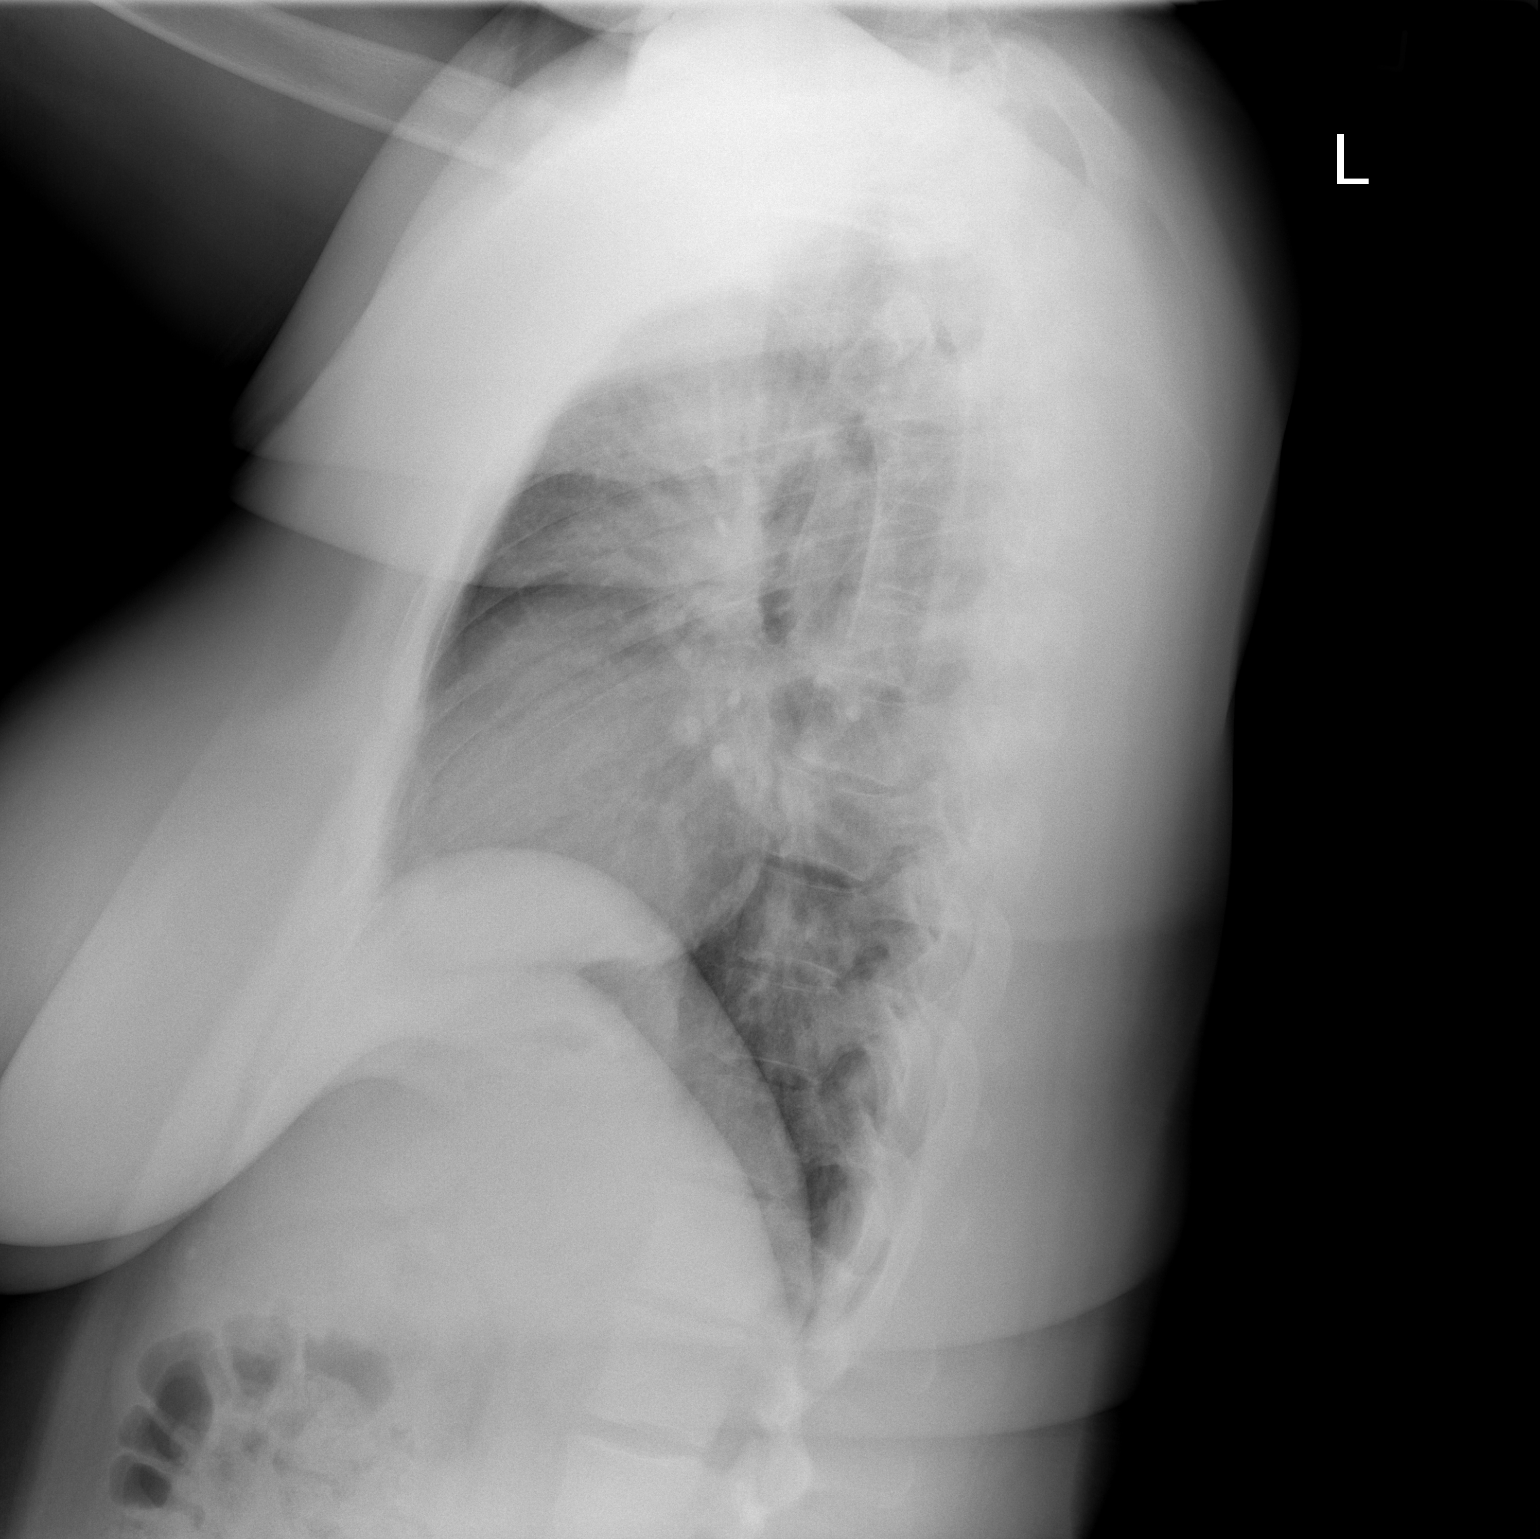

[2 of 2 positions shown; findings below may reference images not displayed]

FINDINGS: Borderline cardiomegaly. No focal opacity or pleural effusion. No
pneumothorax.
IMPRESSION: No active cardiopulmonary disease.

## 2020-09-01 NOTE — ED Triage Notes (Addendum)
Pt presents to ED POV. Pt c/o CP an R arm numbness/tingling and R facial numbness/tingling at rest. Pt reports that is began ~1200 today. Went away then came back. No cardiac hx

## 2020-09-02 ENCOUNTER — Emergency Department (HOSPITAL_BASED_OUTPATIENT_CLINIC_OR_DEPARTMENT_OTHER): Payer: Self-pay

## 2020-09-02 ENCOUNTER — Emergency Department (HOSPITAL_COMMUNITY): Payer: Self-pay

## 2020-09-02 ENCOUNTER — Emergency Department (HOSPITAL_BASED_OUTPATIENT_CLINIC_OR_DEPARTMENT_OTHER)
Admission: EM | Admit: 2020-09-02 | Discharge: 2020-09-02 | Disposition: A | Discharge status: Home / Self Care | Payer: Self-pay | Attending: Emergency Medicine | Admitting: Emergency Medicine

## 2020-09-02 DIAGNOSIS — R200 Anesthesia of skin: Secondary | ICD-10-CM

## 2020-09-02 DIAGNOSIS — R0789 Other chest pain: Secondary | ICD-10-CM

## 2020-09-02 DIAGNOSIS — E041 Nontoxic single thyroid nodule: Secondary | ICD-10-CM

## 2020-09-02 LAB — TROPONIN I (HIGH SENSITIVITY): Troponin I (High Sensitivity): 3 ng/L (ref <18)

## 2020-09-02 LAB — RESP PANEL BY RT-PCR (FLU A&B, COVID) ARPGX2
Influenza A by PCR: NEGATIVE
Influenza B by PCR: NEGATIVE
SARS Coronavirus 2 by RT PCR: NEGATIVE

## 2020-09-02 LAB — D-DIMER, QUANTITATIVE: D-Dimer, Quant: 1.15 ug/mL-FEU — ABNORMAL HIGH (ref 0.00–0.50)

## 2020-09-02 LAB — HCG, SERUM, QUALITATIVE: Preg, Serum: NEGATIVE

## 2020-09-02 IMAGING — MR MR MRA HEAD W/O CM
1 series · 18 of 48 positions shown · IV contrast (gadavist)
Comparison: Same day CT head.

CLINICAL DATA: Neuro deficit, acute, stroke suspected



[Series 11: 3d cow · axial · 0.5mm · 0.41mm/px · z∈[-106,-26]mm · 18 of 172 slices shown]
[im 1/172]
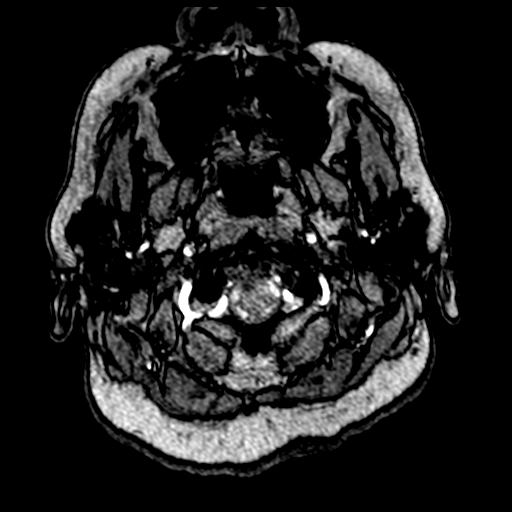
[im 4/172]
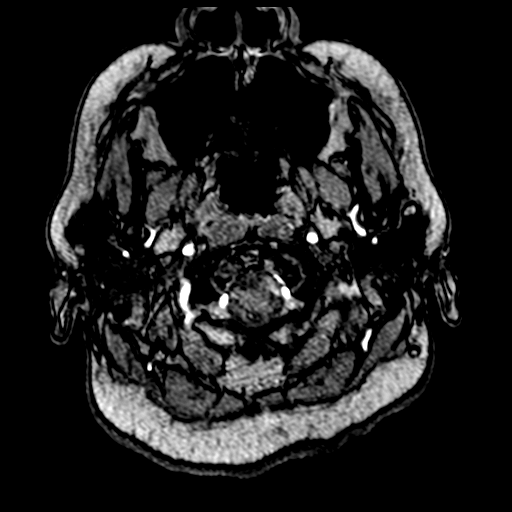
[im 8/172]
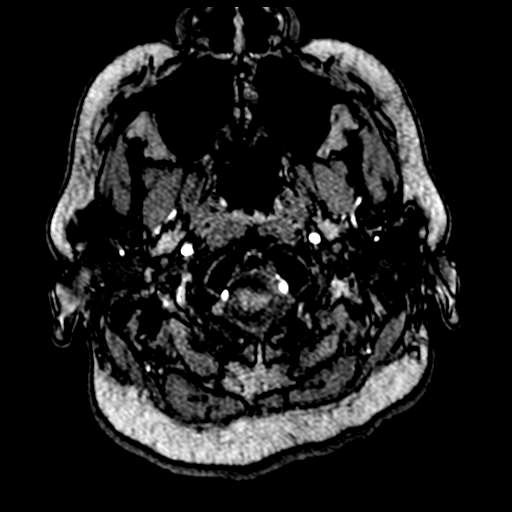
[im 11/172]
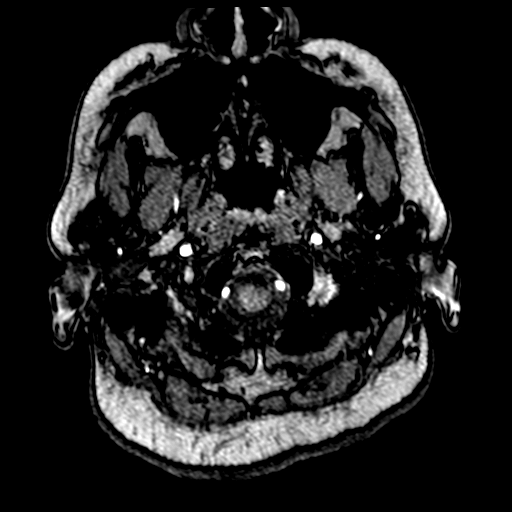
[im 15/172]
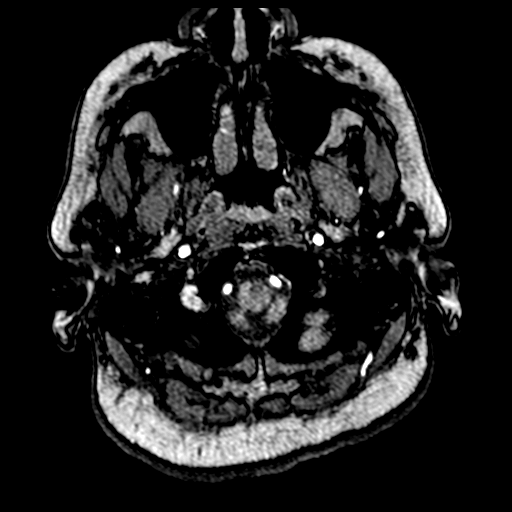
[im 19/172]
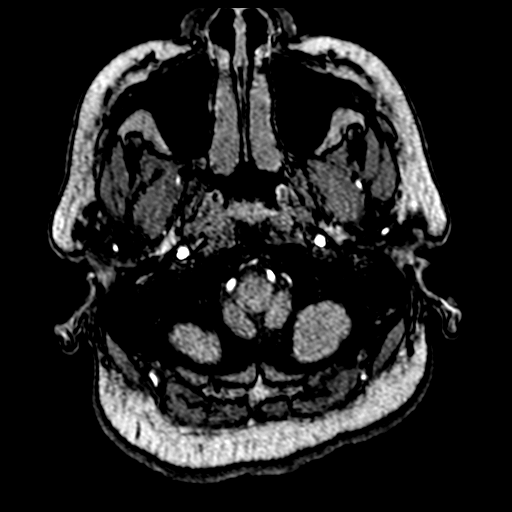
[im 22/172]
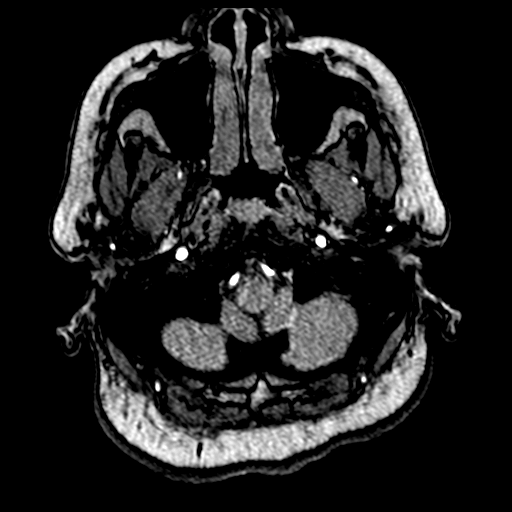
[im 26/172]
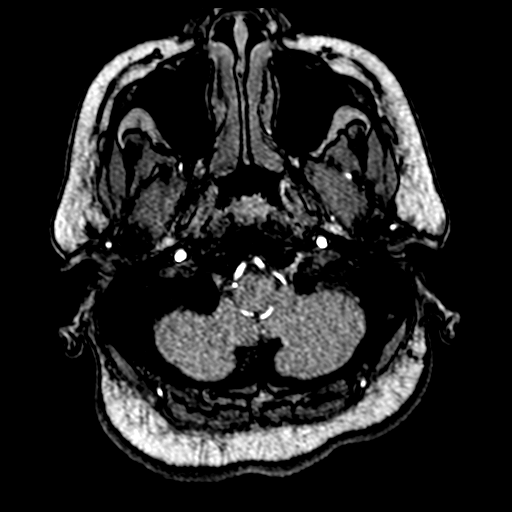
[im 30/172]
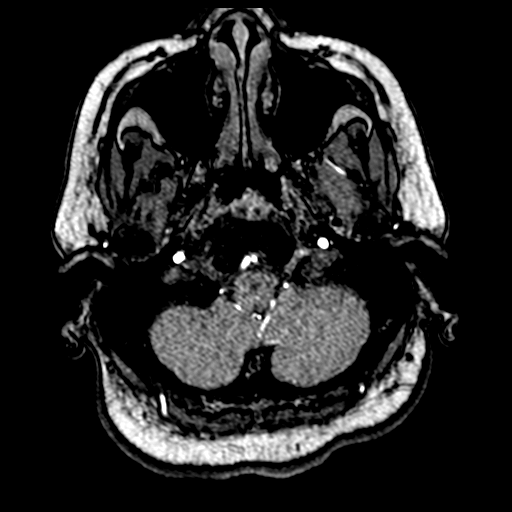
[im 33/172]
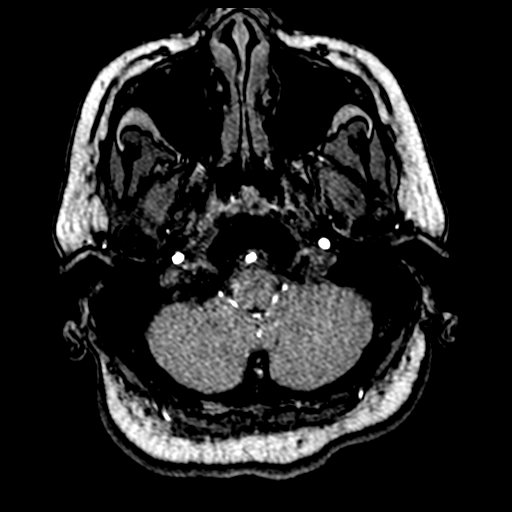
[im 55/172]
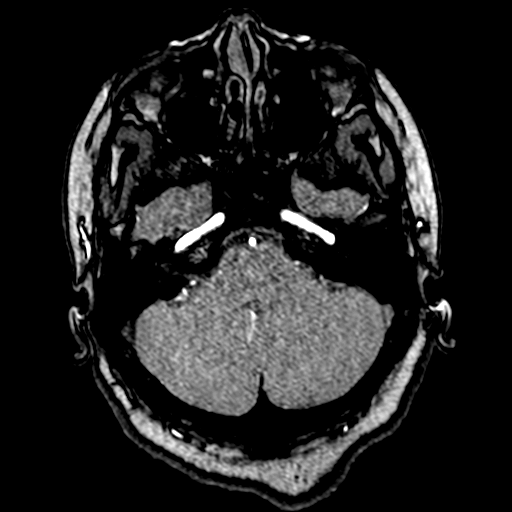
[im 77/172]
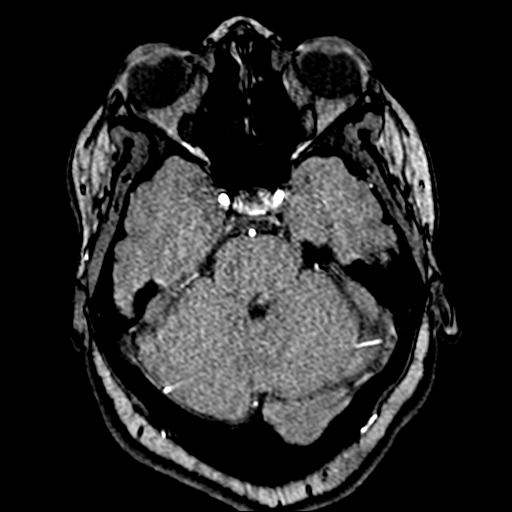
[im 88/172]
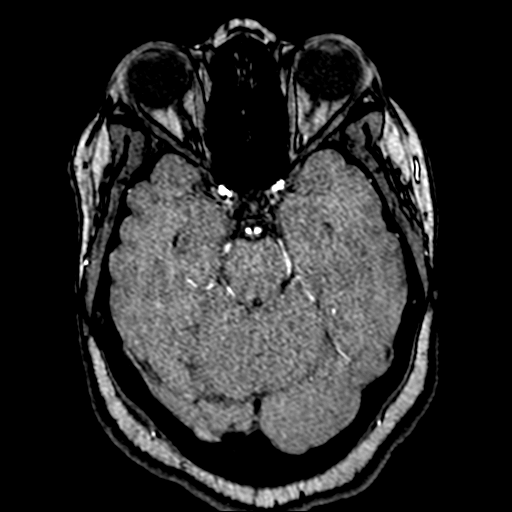
[im 99/172]
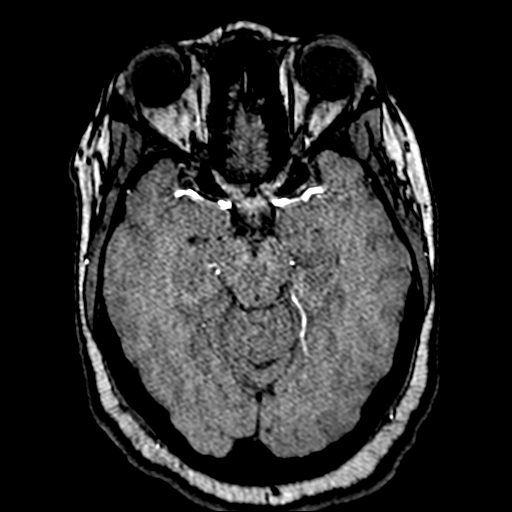
[im 121/172]
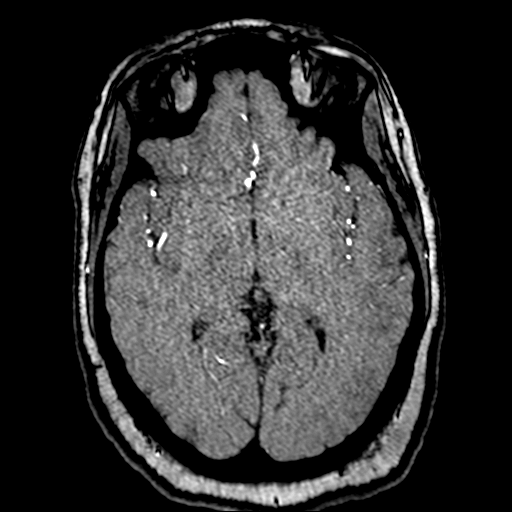
[im 142/172]
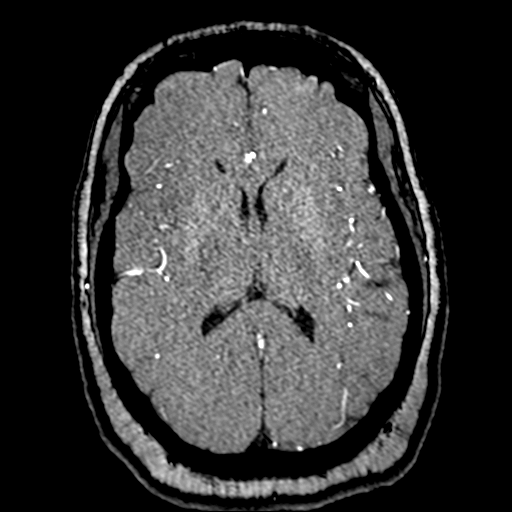
[im 146/172]
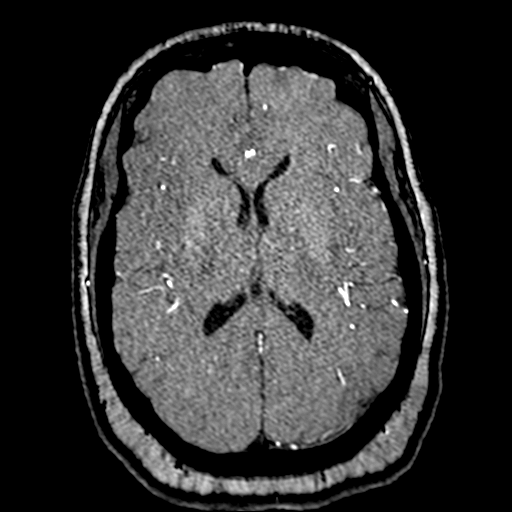
[im 164/172]
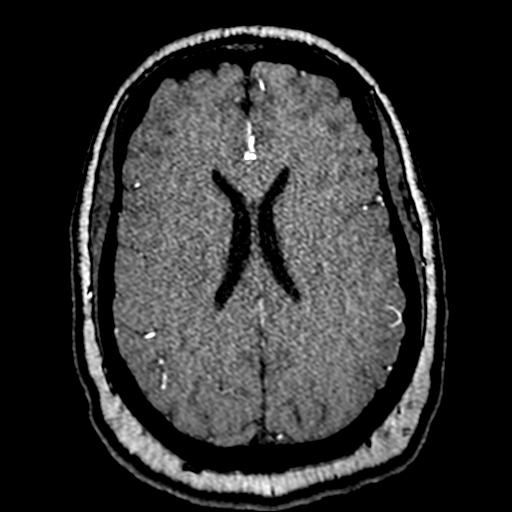

[18 of 48 positions shown; findings below may reference images not displayed]

FINDINGS: MRI HEAD FINDINGS

Brain: No acute infarction, hemorrhage, hydrocephalus, extra-axial
collection or mass lesion. No pathologic intracranial enhancement.
Mild T2 hyperintensities in the frontal predominant white matter,
mildly advanced for age.

Vascular: See below.

Skull and upper cervical spine: Diffuse T1 hypointensity of the
visualized cervical bone marrow

Sinuses/Orbits: Clear sinuses.  Unremarkable orbits.

Other: No sizable mastoid effusions.

MRA HEAD FINDINGS

Anterior circulation: Bilateral ICAs, MCAs, and ACAs are patent. No
proximal hemodynamically significant stenosis. No aneurysm
identified.

Posterior circulation: Bilateral intradural vertebral arteries,
basilar artery, and posterior cerebral arteries are patent without
proximal hemodynamically significant stenosis. Bilateral PICAs are
patent. Fenestration of the proximal basilar artery, anatomic
variant. Bilateral posterior communicating arteries. No aneurysm
identified.

Anatomic variants: See above.

MRA NECK FINDINGS

Aortic arch: Great vessel origins are patent.

Right carotid system: Patent. No significant (greater than 50%)
stenosis.

Left carotid system: Patent. No significant (greater than 50%)
stenosis.

Vertebral arteries: Codominant. Patent bilaterally. No significant
(greater than 50%) stenosis.

Other: None
IMPRESSION: MRI:

1. No evidence of acute intracranial abnormality. Specifically, no
acute infarct.
2. Nonspecific mild T2 hyperintensities in the frontal predominant
white matter, mildly advanced for age and potentially related to
chronic microvascular ischemic disease and/or chronic migraines
given the frontal predominance. Prior demyelination is thought less
likely.
3. Diffuse T1 hypointensity of the visualized cervical bone marrow,
which is nonspecific but can be seen with chronic anemia, chronic
hypoxia (such as in smokers), or a lymphoproliferative disorder.

MRA Head:

No large vessel occlusion or proximal hemodynamically significant
stenosis.

MRA Neck:

No significant (greater than 50%) stenosis.

## 2020-09-02 IMAGING — MR MR HEAD W/O CM
12 of 13 series · 44 of 48 positions shown · IV contrast (gadavist)
Comparison: Same day CT head.

CLINICAL DATA: Neuro deficit, acute, stroke suspected



[Series 9: DWI · axial · 3.0mm · 0.88mm/px · z∈[-105,+34]mm · 8 of 96 slices shown (1 of 4)]
[im 1/96]
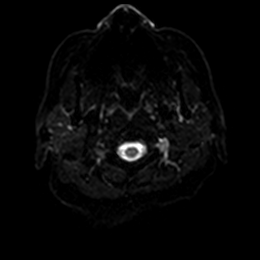
[im 14/96]
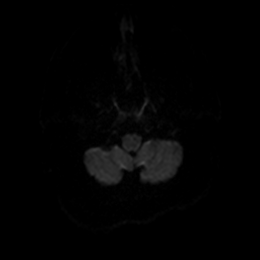
[im 28/96]
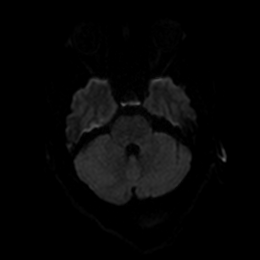
[im 41/96]
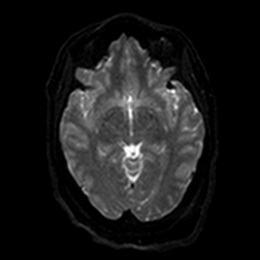
[im 55/96]
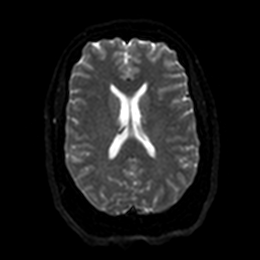
[im 68/96]
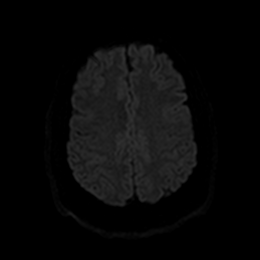
[im 82/96]
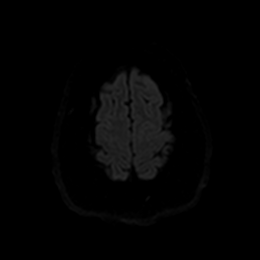
[im 96/96]
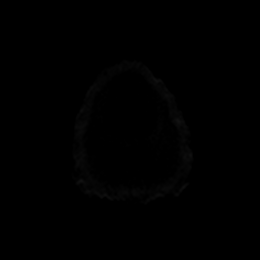

[Series 10: DWI · axial · 3.0mm · 0.88mm/px · z∈[-105,+34]mm · 4 of 48 slices shown (2 of 4)]
[im 1/48]
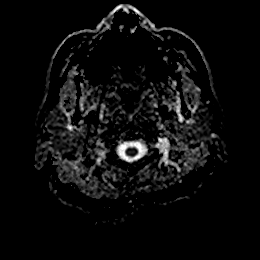
[im 16/48]
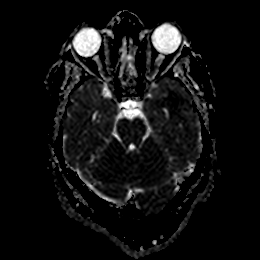
[im 32/48]
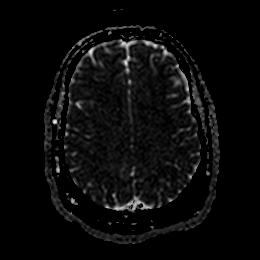
[im 48/48]
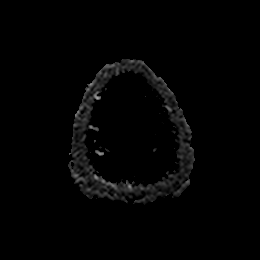

[Series 15: DWI · coronal · 4.0mm · 0.88mm/px · 5 of 68 slices shown (3 of 4)]
[im 1/68]
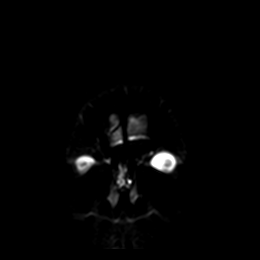
[im 17/68]
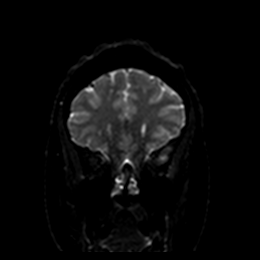
[im 34/68]
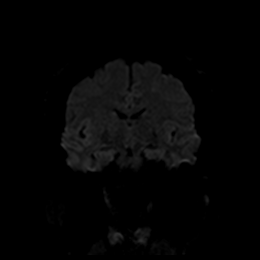
[im 51/68]
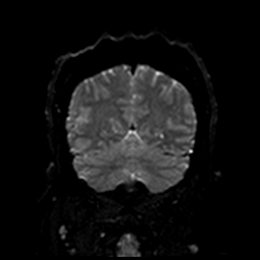
[im 68/68]
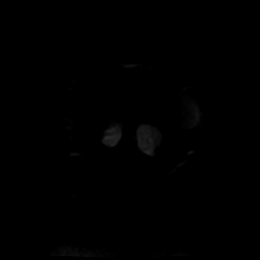

[Series 16: DWI · coronal · 4.0mm · 0.88mm/px · 3 of 34 slices shown (4 of 4)]
[im 1/34]
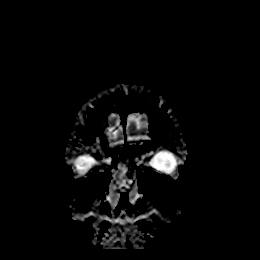
[im 17/34]
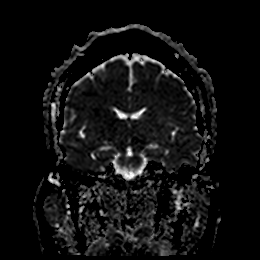
[im 34/34]
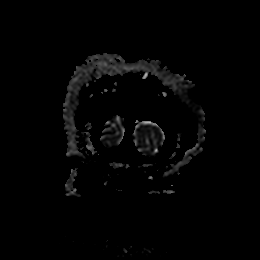

[Series 17: T1 · sagittal · 5.0mm · 0.75mm/px · 2 of 23 slices shown]
[im 1/23]
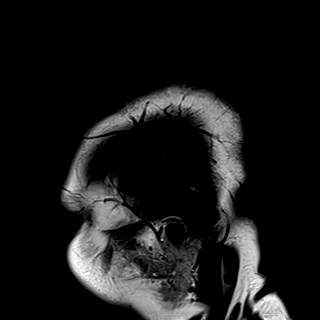
[im 23/23]
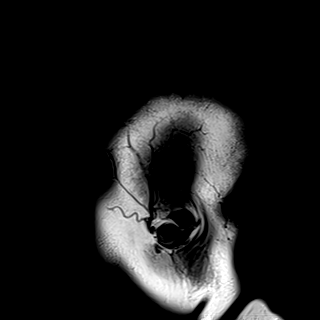

[Series 18: T2 · axial · 5.0mm · 0.72mm/px · z∈[-106,+36]mm · 2 of 25 slices shown (1 of 2)]
[im 1/25]
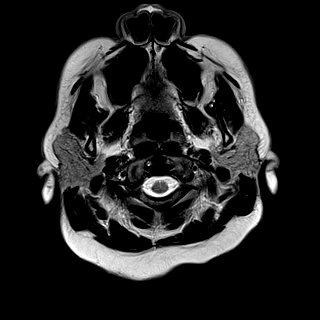
[im 25/25]
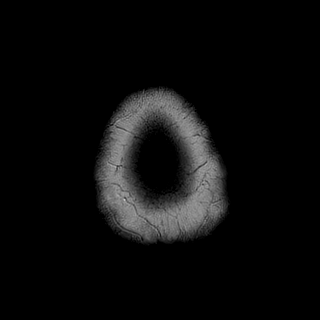

[Series 19: FLAIR · axial · 5.0mm · 0.45mm/px · z∈[-106,+36]mm · 2 of 25 slices shown]
[im 1/25]
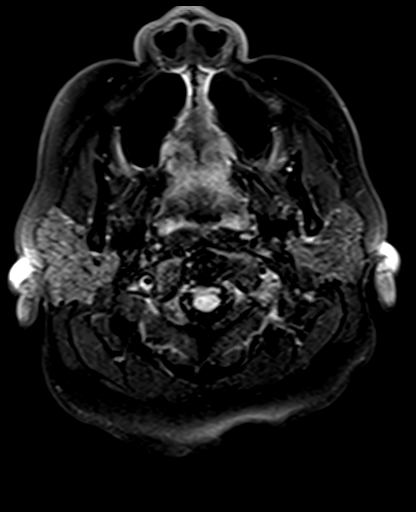
[im 25/25]
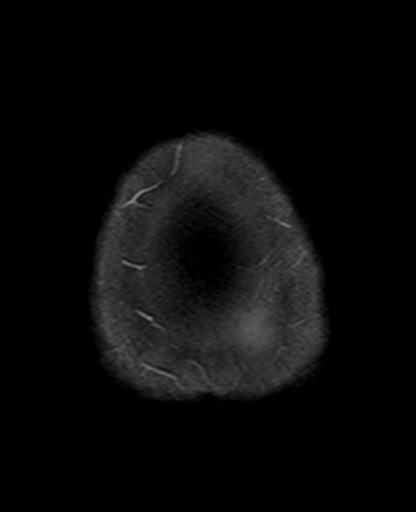

[Series 20: mag_images · axial · 3.0mm · 0.90mm/px · z∈[-123,+52]mm · 4 of 60 slices shown]
[im 1/60]
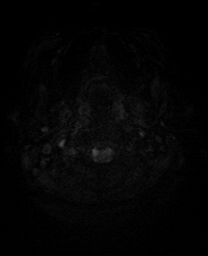
[im 20/60]
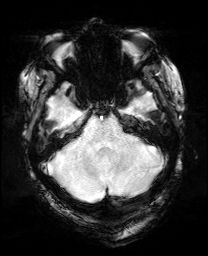
[im 40/60]
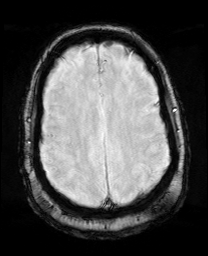
[im 60/60]
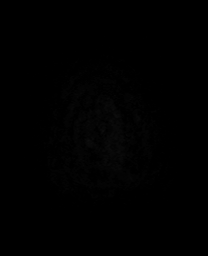

[Series 21: pha_images · axial · 3.0mm · 0.90mm/px · z∈[-123,+52]mm · 4 of 60 slices shown]
[im 1/60]
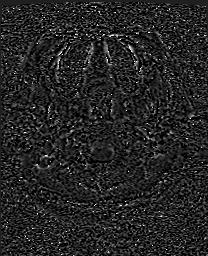
[im 20/60]
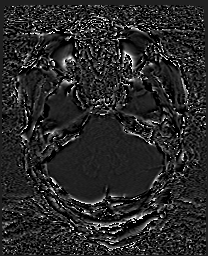
[im 40/60]
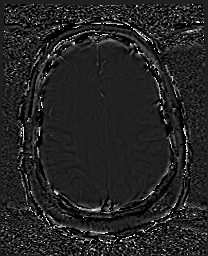
[im 60/60]
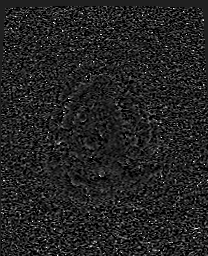

[Series 22: swi_images · axial · 3.0mm · 0.90mm/px · z∈[-123,+52]mm · 4 of 60 slices shown]
[im 1/60]
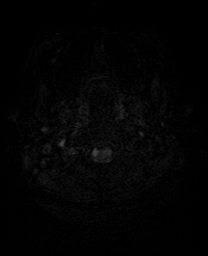
[im 20/60]
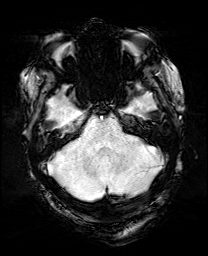
[im 40/60]
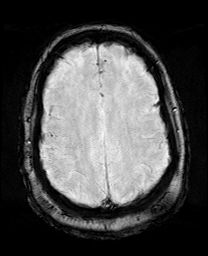
[im 60/60]
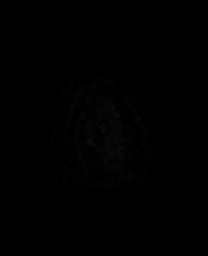

[Series 23: mip_images(sw) · axial · 24.0mm · 0.90mm/px · z∈[-112,+42]mm · 4 of 53 slices shown]
[im 1/53]
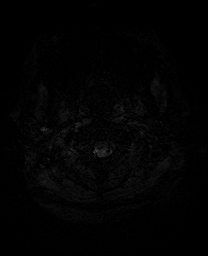
[im 18/53]
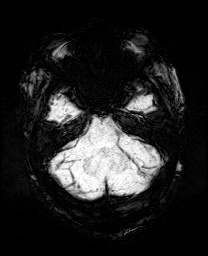
[im 35/53]
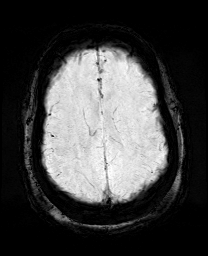
[im 53/53]
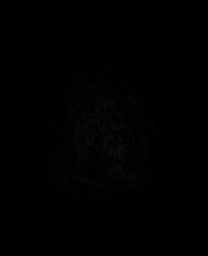

[Series 25: T2 · coronal · 5.0mm · 0.34mm/px · 2 of 29 slices shown (2 of 2)]
[im 1/29]
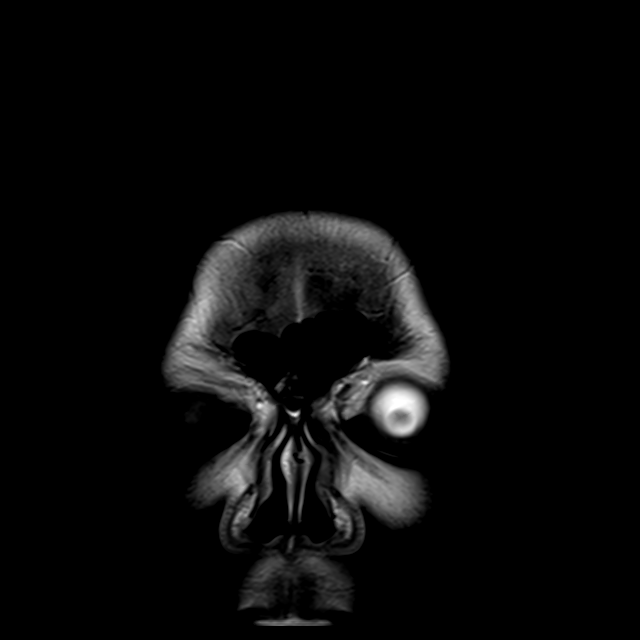
[im 29/29]
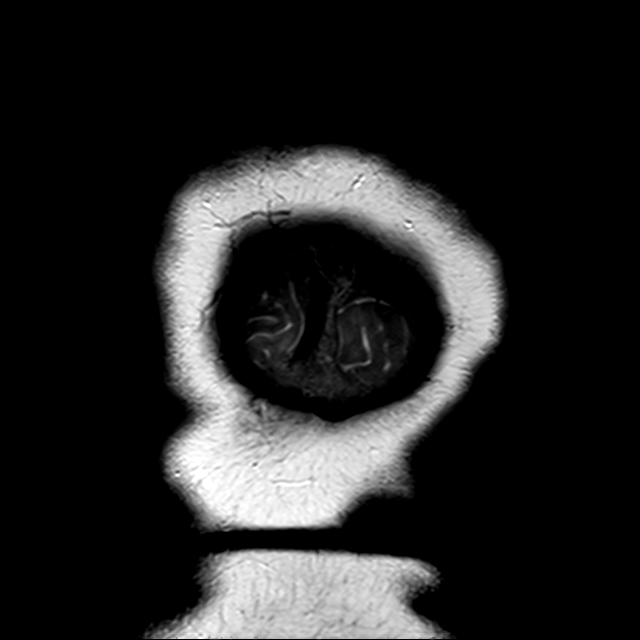

[44 of 48 positions shown; findings below may reference images not displayed]

FINDINGS: MRI HEAD FINDINGS

Brain: No acute infarction, hemorrhage, hydrocephalus, extra-axial
collection or mass lesion. No pathologic intracranial enhancement.
Mild T2 hyperintensities in the frontal predominant white matter,
mildly advanced for age.

Vascular: See below.

Skull and upper cervical spine: Diffuse T1 hypointensity of the
visualized cervical bone marrow

Sinuses/Orbits: Clear sinuses.  Unremarkable orbits.

Other: No sizable mastoid effusions.

MRA HEAD FINDINGS

Anterior circulation: Bilateral ICAs, MCAs, and ACAs are patent. No
proximal hemodynamically significant stenosis. No aneurysm
identified.

Posterior circulation: Bilateral intradural vertebral arteries,
basilar artery, and posterior cerebral arteries are patent without
proximal hemodynamically significant stenosis. Bilateral PICAs are
patent. Fenestration of the proximal basilar artery, anatomic
variant. Bilateral posterior communicating arteries. No aneurysm
identified.

Anatomic variants: See above.

MRA NECK FINDINGS

Aortic arch: Great vessel origins are patent.

Right carotid system: Patent. No significant (greater than 50%)
stenosis.

Left carotid system: Patent. No significant (greater than 50%)
stenosis.

Vertebral arteries: Codominant. Patent bilaterally. No significant
(greater than 50%) stenosis.

Other: None
IMPRESSION: MRI:

1. No evidence of acute intracranial abnormality. Specifically, no
acute infarct.
2. Nonspecific mild T2 hyperintensities in the frontal predominant
white matter, mildly advanced for age and potentially related to
chronic microvascular ischemic disease and/or chronic migraines
given the frontal predominance. Prior demyelination is thought less
likely.
3. Diffuse T1 hypointensity of the visualized cervical bone marrow,
which is nonspecific but can be seen with chronic anemia, chronic
hypoxia (such as in smokers), or a lymphoproliferative disorder.

MRA Head:

No large vessel occlusion or proximal hemodynamically significant
stenosis.

MRA Neck:

No significant (greater than 50%) stenosis.

## 2020-09-02 IMAGING — MR MR MRA NECK WO/W CM
3 series · 40 of 48 positions shown · IV contrast (Gadavist)
Comparison: Same day CT head.

CLINICAL DATA: Neuro deficit, acute, stroke suspected



[Series 21: tof_fl3d_tra_iso · axial · 0.6mm · 0.52mm/px · z∈[-160,-81]mm · 22 of 133 slices shown]
[im 1/133]
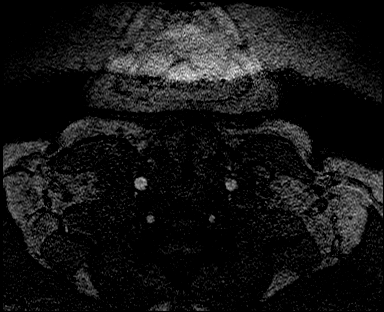
[im 7/133]
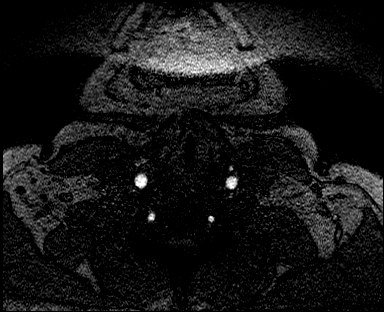
[im 13/133]
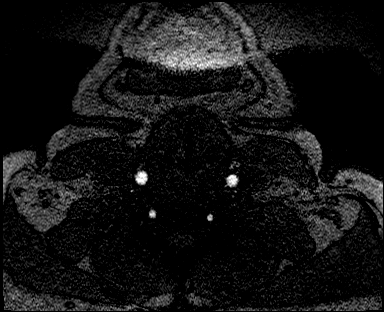
[im 19/133]
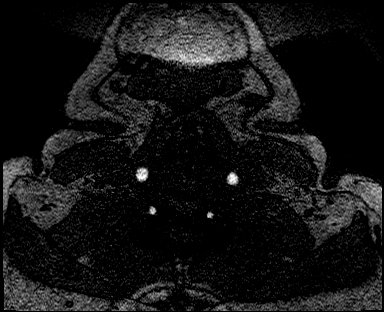
[im 26/133]
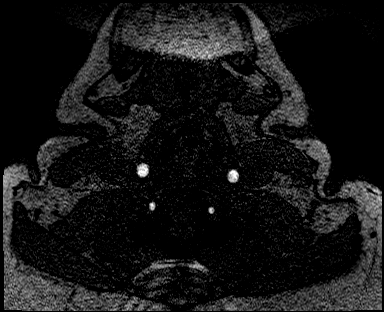
[im 32/133]
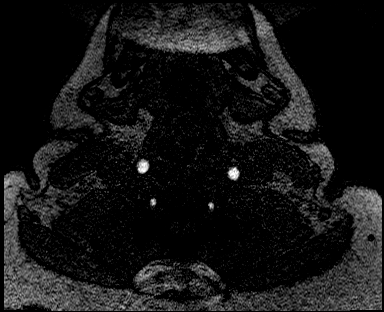
[im 38/133]
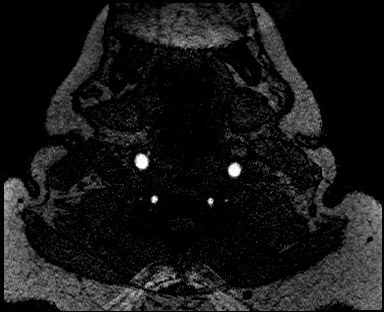
[im 45/133]
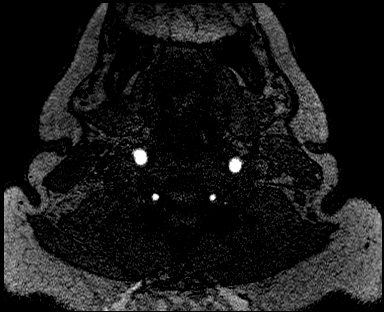
[im 51/133]
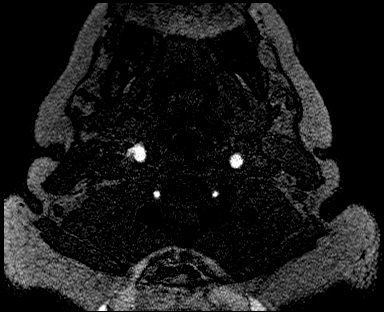
[im 57/133]
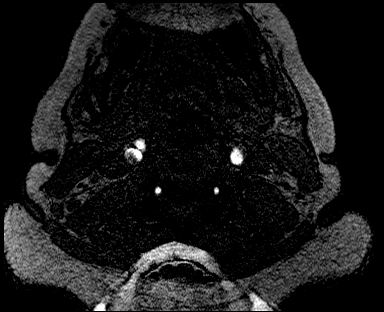
[im 63/133]
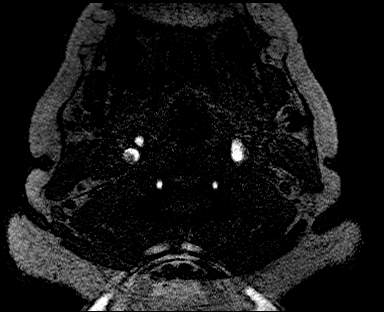
[im 70/133]
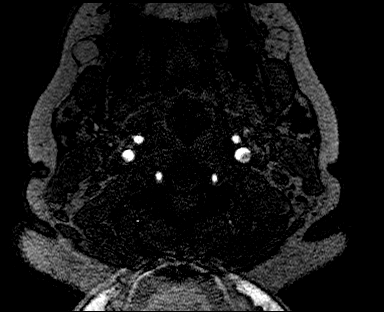
[im 76/133]
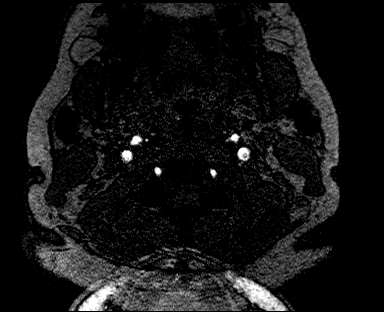
[im 82/133]
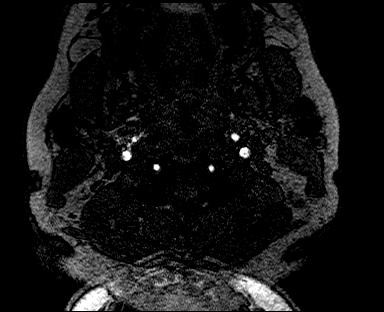
[im 89/133]
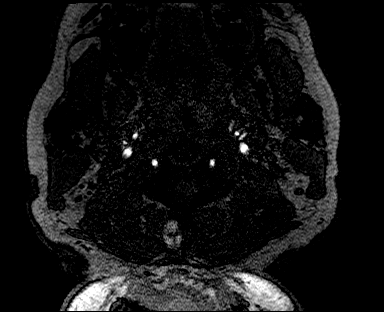
[im 95/133]
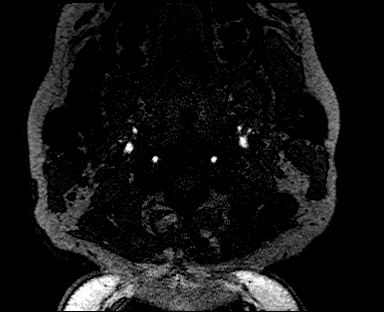
[im 101/133]
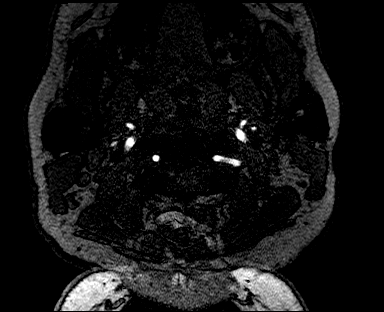
[im 107/133]
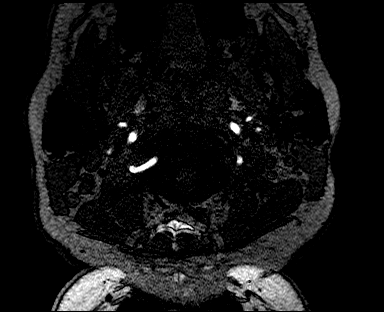
[im 114/133]
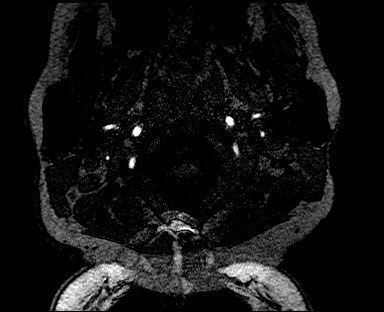
[im 120/133]
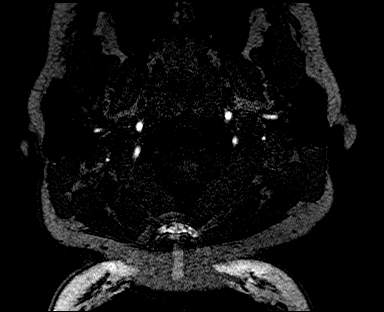
[im 126/133]
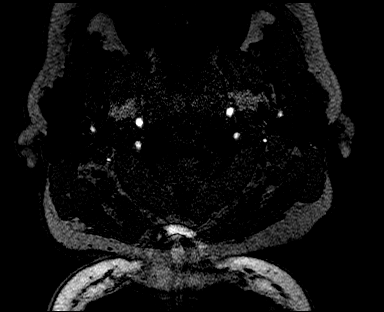
[im 133/133]
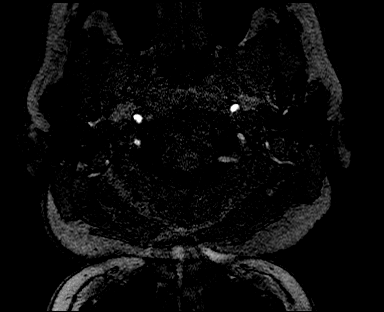

[Series 26: angio_fl3d_cor_post_ttc=3.0s · coronal · 0.9mm · 0.85mm/px · 9 of 80 slices shown (1 of 2)]
[im 1/80]
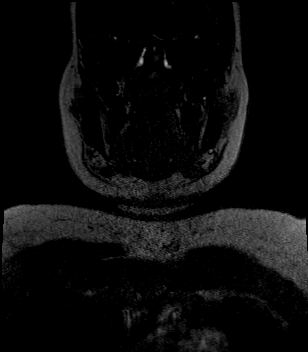
[im 14/80]
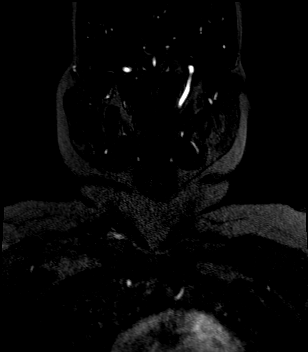
[im 27/80]
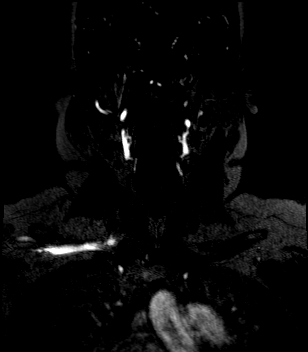
[im 33/80]
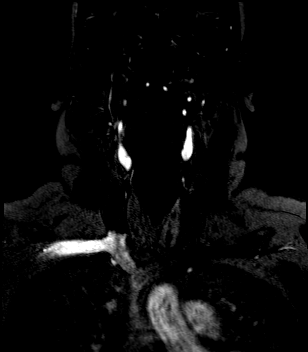
[im 40/80]
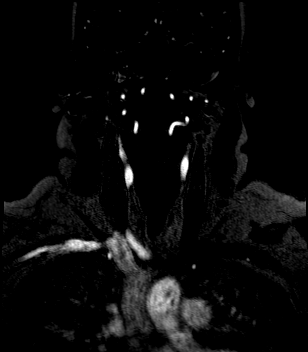
[im 47/80]
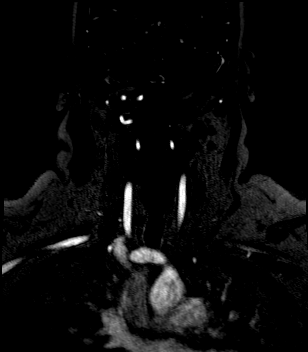
[im 53/80]
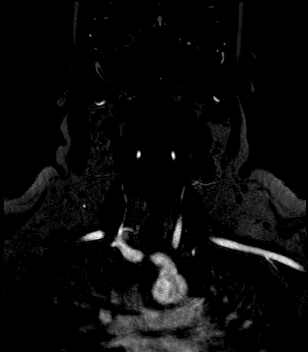
[im 66/80]
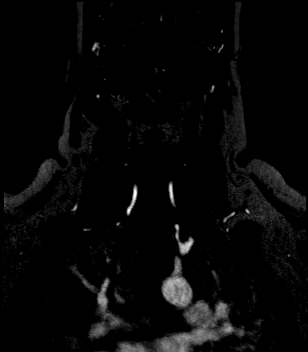
[im 80/80]
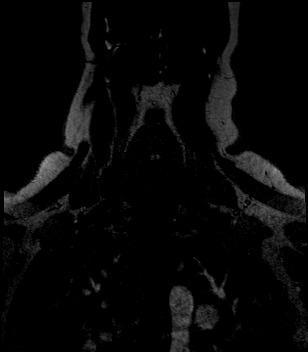

[Series 30: angio_fl3d_cor_post_ttc=3.0s · coronal · 0.9mm · 0.85mm/px · 9 of 80 slices shown (2 of 2)]
[im 1/80]
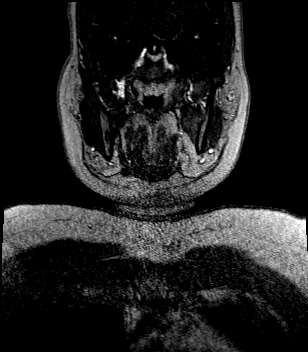
[im 14/80]
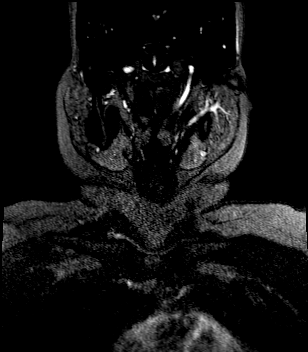
[im 27/80]
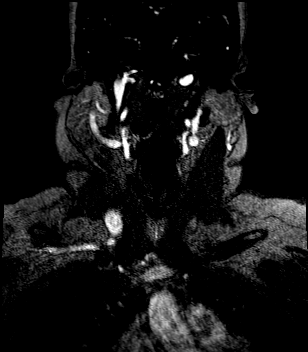
[im 33/80]
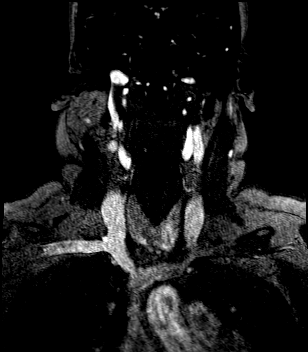
[im 40/80]
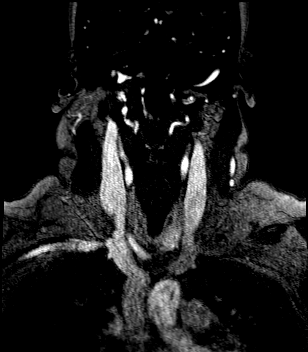
[im 47/80]
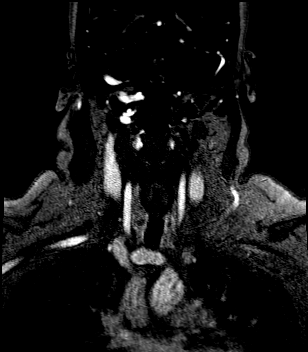
[im 53/80]
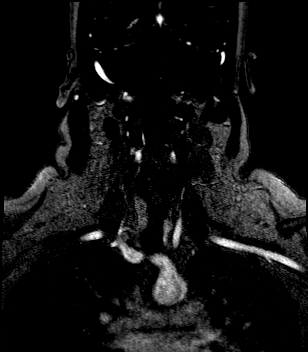
[im 66/80]
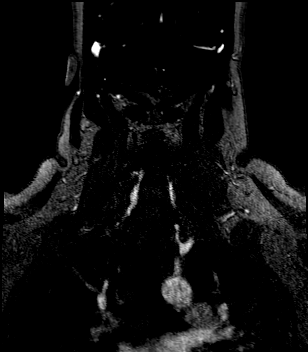
[im 80/80]
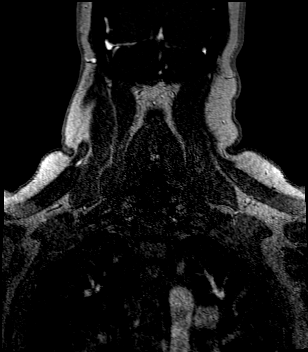

[40 of 48 positions shown; findings below may reference images not displayed]

FINDINGS: MRI HEAD FINDINGS

Brain: No acute infarction, hemorrhage, hydrocephalus, extra-axial
collection or mass lesion. No pathologic intracranial enhancement.
Mild T2 hyperintensities in the frontal predominant white matter,
mildly advanced for age.

Vascular: See below.

Skull and upper cervical spine: Diffuse T1 hypointensity of the
visualized cervical bone marrow

Sinuses/Orbits: Clear sinuses.  Unremarkable orbits.

Other: No sizable mastoid effusions.

MRA HEAD FINDINGS

Anterior circulation: Bilateral ICAs, MCAs, and ACAs are patent. No
proximal hemodynamically significant stenosis. No aneurysm
identified.

Posterior circulation: Bilateral intradural vertebral arteries,
basilar artery, and posterior cerebral arteries are patent without
proximal hemodynamically significant stenosis. Bilateral PICAs are
patent. Fenestration of the proximal basilar artery, anatomic
variant. Bilateral posterior communicating arteries. No aneurysm
identified.

Anatomic variants: See above.

MRA NECK FINDINGS

Aortic arch: Great vessel origins are patent.

Right carotid system: Patent. No significant (greater than 50%)
stenosis.

Left carotid system: Patent. No significant (greater than 50%)
stenosis.

Vertebral arteries: Codominant. Patent bilaterally. No significant
(greater than 50%) stenosis.

Other: None
IMPRESSION: MRI:

1. No evidence of acute intracranial abnormality. Specifically, no
acute infarct.
2. Nonspecific mild T2 hyperintensities in the frontal predominant
white matter, mildly advanced for age and potentially related to
chronic microvascular ischemic disease and/or chronic migraines
given the frontal predominance. Prior demyelination is thought less
likely.
3. Diffuse T1 hypointensity of the visualized cervical bone marrow,
which is nonspecific but can be seen with chronic anemia, chronic
hypoxia (such as in smokers), or a lymphoproliferative disorder.

MRA Head:

No large vessel occlusion or proximal hemodynamically significant
stenosis.

MRA Neck:

No significant (greater than 50%) stenosis.

## 2020-09-02 IMAGING — CT CT ANGIO CHEST
2 of 8 series · 18 of 36 positions shown · IV contrast (Omnipaque)
Comparison: CT [DATE]

CLINICAL DATA: Right arm numbness, tingling, right facial numbness
and tingling at rest, initially resolved then returned

EXAM:
CT ANGIOGRAPHY CHEST WITH CONTRAST
TECHNIQUE: Multidetector CT imaging of the chest was performed using the
standard protocol during bolus administration of intravenous
contrast. Multiplanar CT image reconstructions and MIPs were
obtained to evaluate the vascular anatomy.
CONTRAST:  100mL OMNIPAQUE IOHEXOL 350 MG/ML SOLN

[Series 5: pe thins · axial · 0.80mm/px · z∈[+983,+1255]mm · 17 of 306 slices shown]
[im 17/306  lung]
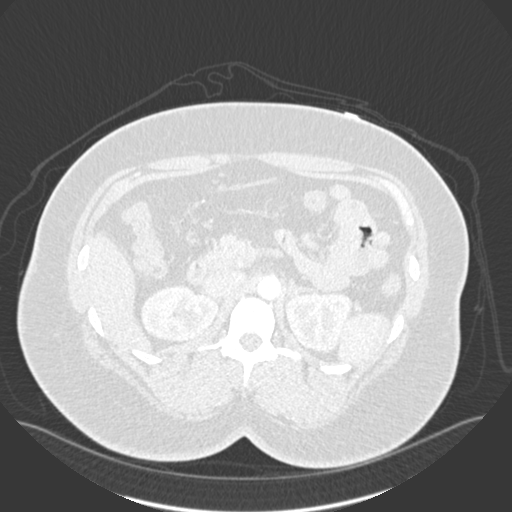
[im 33/306  mediastinal]
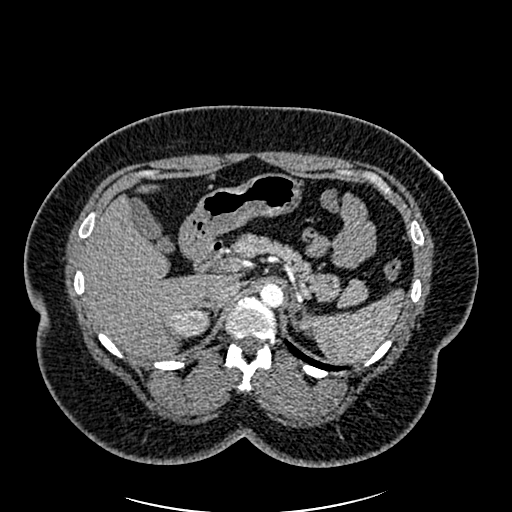
[im 49/306  lung]
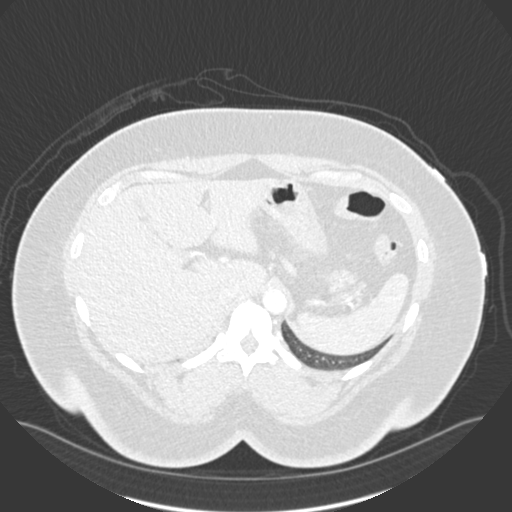
[im 65/306  mediastinal]
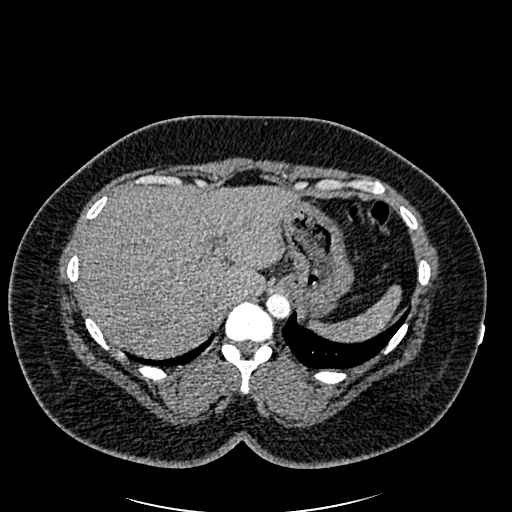
[im 81/306  lung]
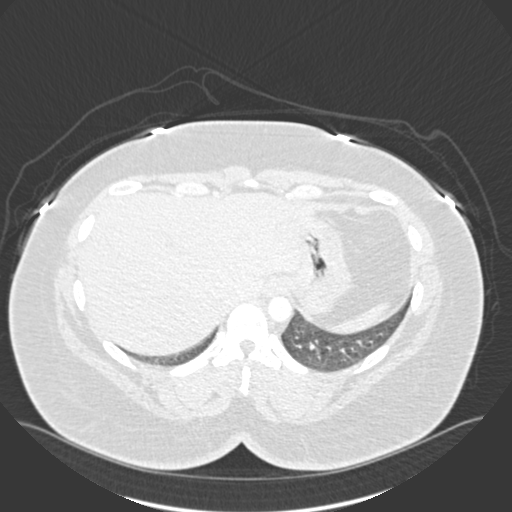
[im 97/306  mediastinal]
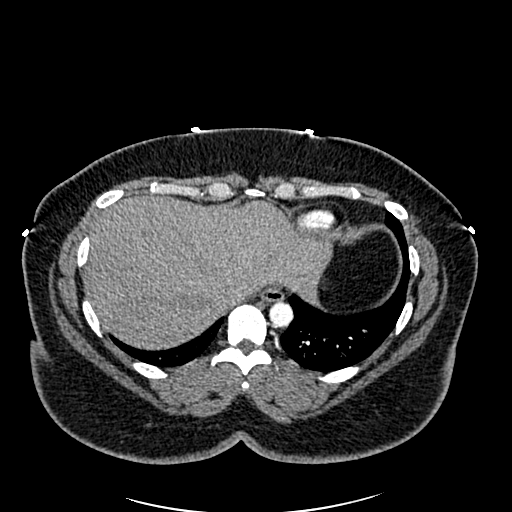
[im 113/306  lung]
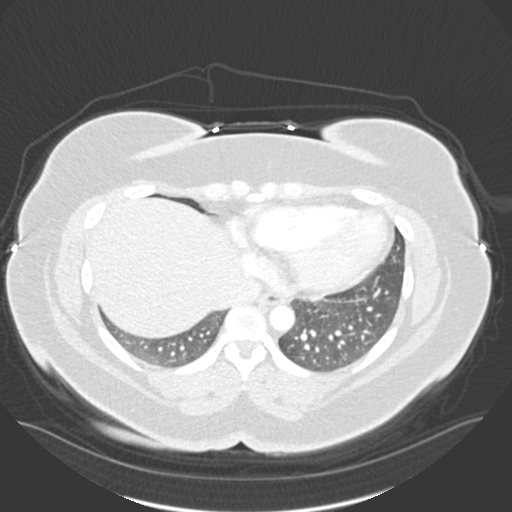
[im 129/306  mediastinal]
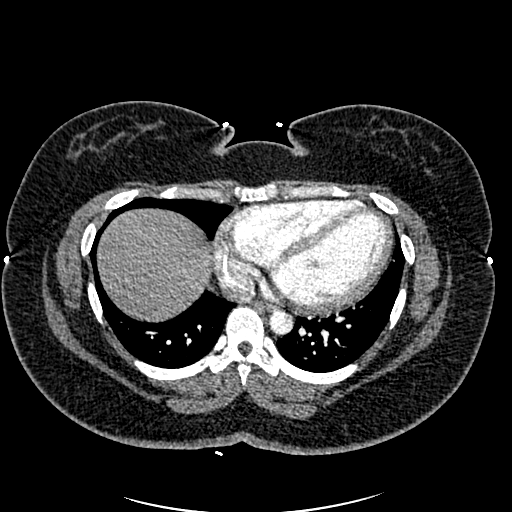
[im 161/306  lung]
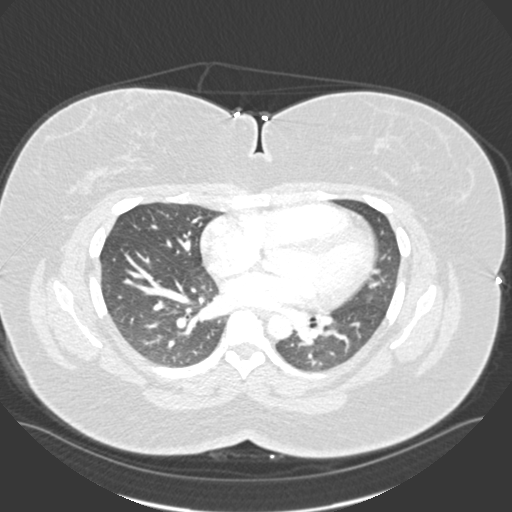
[im 177/306  mediastinal]
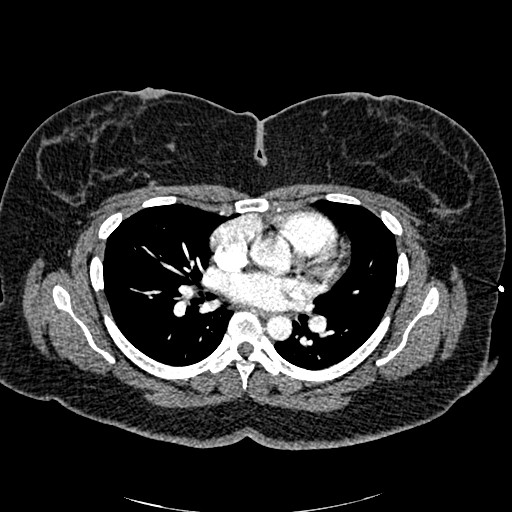
[im 193/306  lung]
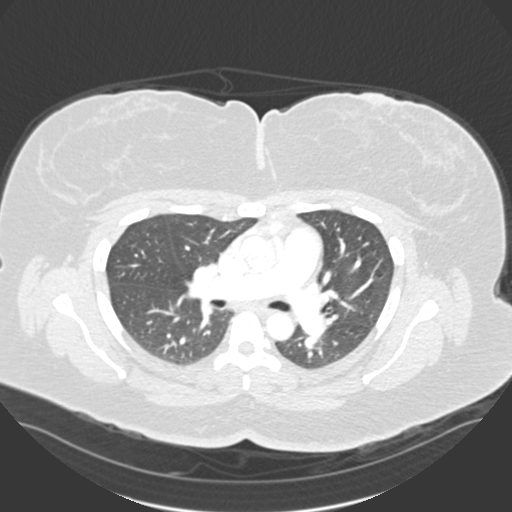
[im 209/306  mediastinal]
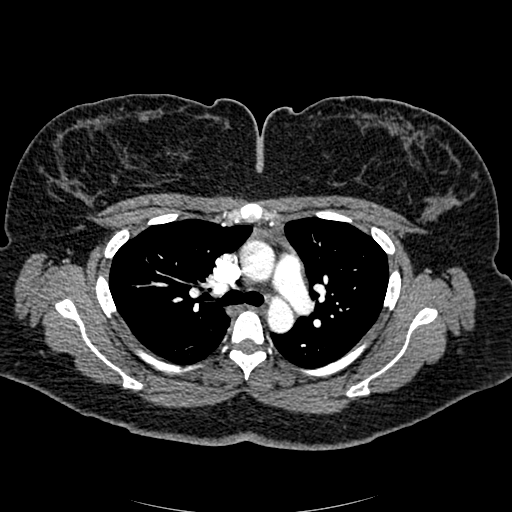
[im 225/306  lung]
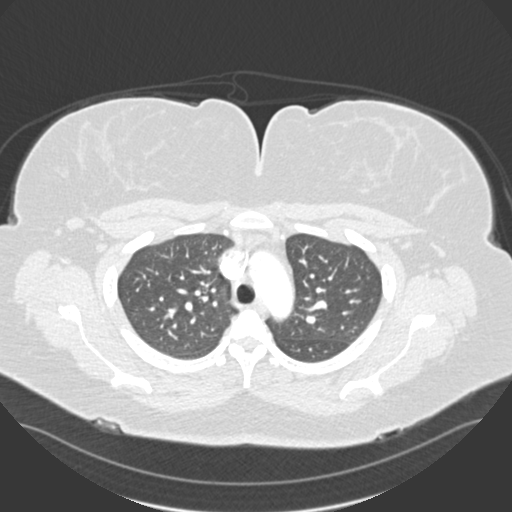
[im 241/306  mediastinal]
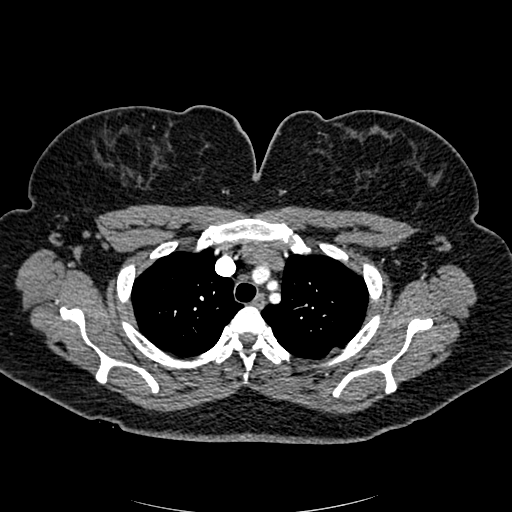
[im 257/306  lung]
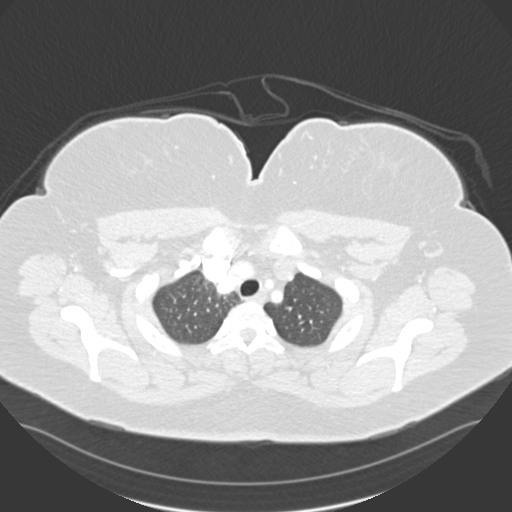
[im 273/306  mediastinal]
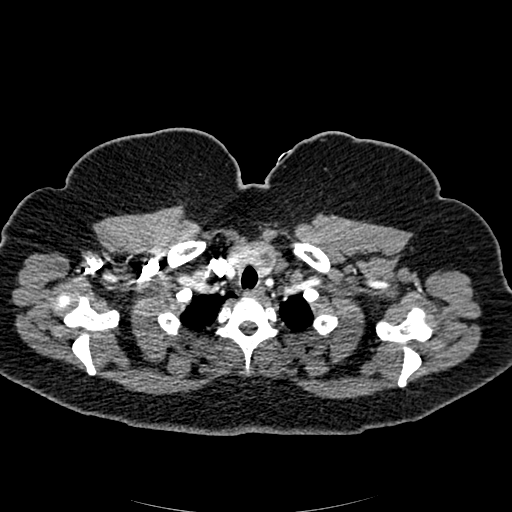
[im 289/306  lung]
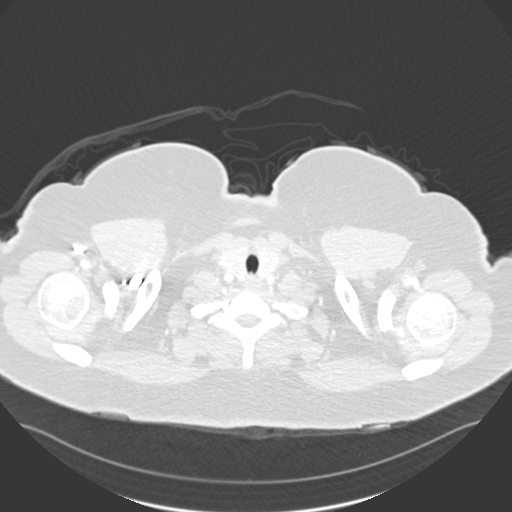

[Series 7: pe coronal mpr · coronal · 0.60mm/px · 1 of 151 slices shown]
[im 76/151  mediastinal]
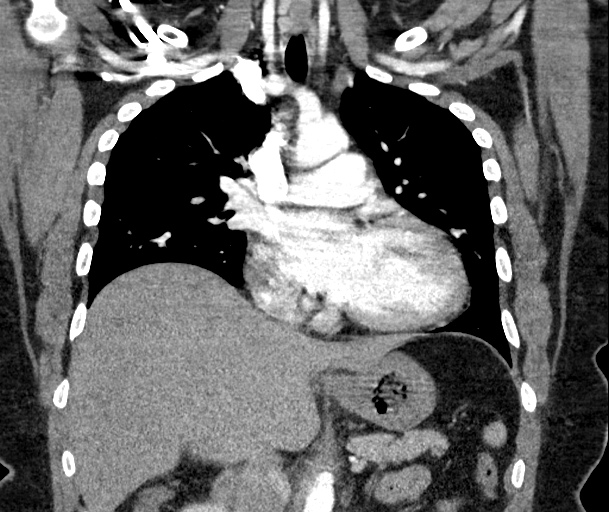

[18 of 36 positions shown; findings below may reference images not displayed]

FINDINGS: Cardiovascular: Satisfactory opacification the pulmonary arteries to
the segmental level. No pulmonary artery filling defects are
identified. Central pulmonary arteries are normal caliber. Normal
heart size. No pericardial effusion. The aortic root is suboptimally
assessed given cardiac pulsation artifact. The aorta is normal
caliber. No acute luminal abnormality of the imaged aorta. No
periaortic stranding or hemorrhage. Normal 3 vessel branching of the
aortic arch. Proximal great vessels are unremarkable.

Mediastinum/Nodes: Small amount of fluid in the pericardial
recesses, likely within physiologic normal. No other free
mediastinal fluid or gas. No acute abnormality of the trachea or
esophagus. 11 mm hypoattenuating nodule in the left thyroid gland.
Additional subcentimeter hypoattenuating nodules seen elsewhere
within the gland. No worrisome mediastinal, hilar or axillary
adenopathy.

Lungs/Pleura: No consolidation, features of edema, pneumothorax, or
effusion. No suspicious pulmonary nodules or masses.

Upper Abdomen: Diffuse hepatic hypoattenuation, possibly contrast
timing related or the result of hepatic steatosis. Prominent fold at
the gallbladder fundus. Heterogeneous enhancement of the spleen,
typical for arterial phase imaging.

Musculoskeletal: No acute osseous abnormality or suspicious osseous
lesion.

Review of the MIP images confirms the above findings.
IMPRESSION: No evidence of pulmonary artery embolism.

11 mm hypoattenuating nodule left lobe thyroid gland with additional
tiny subcentimeter nodules. Recommend thyroid US (ref: [HOSPITAL]. [DATE]): 143-50).

No other acute or significant intrathoracic process.

Diffuse hepatic hypoattenuation, may be contrast timing related or
secondary to hepatic steatosis.

## 2020-09-02 MED ORDER — ASPIRIN EC 81 MG PO TBEC: 81.0000 mg | DELAYED_RELEASE_TABLET | Freq: Every day | ORAL | 11 refills | Status: AC

## 2020-09-02 MED ORDER — IOHEXOL 350 MG/ML SOLN
100.0000 mL | Freq: Once | INTRAVENOUS | Status: AC | PRN
  Administered 2020-09-02: 100 mL via INTRAVENOUS

## 2020-09-02 MED ORDER — GADOBUTROL 1 MMOL/ML IV SOLN
10.0000 mL | Freq: Once | INTRAVENOUS | Status: AC | PRN
  Administered 2020-09-02: 10 mL via INTRAVENOUS

## 2020-09-02 NOTE — ED Notes (Signed)
Pt declined changing into hospital provided gown and footwear. Request to remain in clothing until after transportation.

## 2020-09-02 NOTE — ED Notes (Signed)
Pt reports sudden sternal cp with right arm numbness that lasted 1 hr. Episode occurred suddenly while typing on a computer yesterday around lunchtime. Denies numbness and denies cp currently. Transfer consent signed. Alert, oriented and ambulatory on departure.

## 2020-09-02 NOTE — ED Notes (Signed)
Pt transferred via [facility] for MRI

## 2020-09-02 NOTE — ED Notes (Signed)
[facility] notified for pt transfer 

## 2020-09-02 NOTE — ED Notes (Signed)
Care assumed. Patient in MRI

## 2020-09-02 NOTE — ED Notes (Signed)
Patient transported to CT 

## 2020-09-02 NOTE — Discharge Instructions (Addendum)
There is no evidence of stroke, heart attack or blood clot in the lung.  You should follow-up with the neurologist.  Your CT scan did show a nodule of your thyroid gland and your doctor should schedule ultrasound of this for further evaluation.  Recommend 81 mg of aspirin daily.

## 2020-09-02 NOTE — ED Provider Notes (Signed)
  Provider Note MRN:  [id number]  Arrival date & time: 09/02/20    ED Course and Medical Decision Making  Assumed care from Dr. Manus Gunning upon patient transfer.  Transfer for MRI after brief episode of right arm numbness and tingling.  Currently back to normal, feels fine.  Awaiting results.  Per neurology plan is for discharge on daily baby aspirin if MRI is normal.  Signed out to oncoming provider at shift change.  Procedures  Final Clinical Impressions(s) / ED Diagnoses     ICD-10-CM   1. Right sided numbness  R20.0     2. Atypical chest pain  R07.89     3. Thyroid nodule  E04.1       ED Discharge Orders     None         Discharge Instructions      There is no evidence of stroke, heart attack or blood clot in the lung.  You should follow-up with the neurologist.  Your CT scan did show a nodule of your thyroid gland and your doctor should schedule ultrasound of this for further evaluation.    Elmer Sow. Pilar Plate, MD [facility] [email or web address]    Sabas Sous, MD 09/02/20 [phone]

## 2020-09-02 NOTE — ED Notes (Signed)
Mri has called will be taking her in about 30 minutes

## 2020-09-02 NOTE — ED Provider Notes (Signed)
Patient sent here from [facility] for MRI which was unremarkable.  No stroke.  Overall MRI is unremarkable.  We will start her on a daily aspirin and have her follow-up with neurology.   Virgina Norfolk, DO 09/02/20 [phone]

## 2020-09-02 NOTE — ED Notes (Signed)
Pt transferred from [facility] for a mri  Pt is waiting no pain  No other symptoms

## 2020-09-02 NOTE — ED Provider Notes (Signed)
[facility] EMERGENCY DEPARTMENT Provider Note   CSN: [id number] Arrival date & time: 09/01/20  2256     History Chief Complaint  Patient presents with   Chest Pain    Jenna Hickman is a 35 y.o. female.  Patient states while she was sitting at work about 12:00 she developed numbness and tingling and heaviness to her right arm.  At the same time she had tightness in the center of her chest.  Symptoms resolved after approximately 1 hour.  About 9 PM tonight symptoms returned including numbness and tingling in her right arm as well as right lower face.  Had tightness in her chest at this time as well.  Symptoms resolved after about 45 minutes.  She feels back to baseline now.  No weakness in the arm but said it did feel "heavy".  No weakness in the leg.  No difficulty speaking or difficulty swallowing.  No headache at the time.  No current chest tightness or chest pain.  No shortness of breath, nausea, vomiting, cough or fever.  No history of migraine headaches.  No cardiac disease.  She is never had this happen before.  She initially thought it could be her reflux but has never had arm numbness or tingling.  No history of complicated migraine headaches. No visual changes.  States she developed a "heavy feeling" in her upper arm all at once but denies any pins-and-needles, feeling.  The history is provided by the patient.  Chest Pain Associated symptoms: numbness   Associated symptoms: no abdominal pain, no cough, no dizziness, no fever, no headache, no nausea, no shortness of breath, no vomiting and no weakness       No past medical history on file.  There are no problems to display for this patient.   No past surgical history on file.   OB History   No obstetric history on file.     No family history on file.  Social History   Tobacco Use   Smoking status: Never   Smokeless tobacco: Never  Vaping Use   Vaping Use: Never used  Substance Use Topics   Alcohol  use: Never   Drug use: Never    Home Medications Prior to Admission medications   Medication Sig Start Date End Date Taking? Authorizing Provider  amoxicillin (AMOXIL) 500 MG capsule Take 2 capsules (1,000 mg total) by mouth 3 (three) times daily. 01/30/17   Cathren Laine, MD  famotidine (PEPCID) 20 MG tablet Take 1 tablet (20 mg total) by mouth 2 (two) times daily. 08/05/20 10/04/20  Gailen Shelter, PA  omeprazole (PRILOSEC) 20 MG capsule Take 1 capsule (20 mg total) by mouth daily. 04/12/20   Farrel Gordon, PA-C  oseltamivir (TAMIFLU) 75 MG capsule Take 1 capsule (75 mg total) by mouth every 12 (twelve) hours. 02/20/17   Gwyneth Sprout, MD    Allergies    No known allergies  Review of Systems   Review of Systems  Constitutional:  Negative for activity change, appetite change and fever.  HENT:  Negative for congestion.   Respiratory:  Positive for chest tightness. Negative for cough and shortness of breath.   Cardiovascular:  Positive for chest pain.  Gastrointestinal:  Negative for abdominal pain, nausea and vomiting.  Genitourinary:  Negative for dysuria and hematuria.  Musculoskeletal:  Negative for arthralgias and myalgias.  Skin:  Negative for rash.  Neurological:  Positive for numbness. Negative for dizziness, syncope, speech difficulty, weakness and headaches.   all other  systems are negative except as noted in the HPI and PMH.   Physical Exam Updated Vital Signs BP (!) 156/105 (BP Location: Right Arm)   Pulse 85   Temp 98.9 F (37.2 C) (Oral)   Resp 18   Ht 5\' 8"  (1.727 m)   Wt 113.9 kg   LMP 08/15/2020   SpO2 100%   BMI 38.16 kg/m   Physical Exam Vitals and nursing note reviewed.  Constitutional:      General: She is not in acute distress.    Appearance: She is well-developed.  HENT:     Head: Normocephalic and atraumatic.     Mouth/Throat:     Pharynx: No oropharyngeal exudate.  Eyes:     Conjunctiva/sclera: Conjunctivae normal.     Pupils: Pupils are  equal, round, and reactive to light.  Neck:     Comments: No meningismus. Cardiovascular:     Rate and Rhythm: Normal rate and regular rhythm.     Heart sounds: Normal heart sounds. No murmur heard. Pulmonary:     Effort: Pulmonary effort is normal. No respiratory distress.     Breath sounds: Normal breath sounds.  Abdominal:     Palpations: Abdomen is soft.     Tenderness: There is no abdominal tenderness. There is no guarding or rebound.  Musculoskeletal:        General: No tenderness. Normal range of motion.     Cervical back: Normal range of motion and neck supple.  Skin:    General: Skin is warm.     Findings: No rash.  Neurological:     Mental Status: She is alert and oriented to person, place, and time.     Cranial Nerves: No cranial nerve deficit.     Motor: No abnormal muscle tone.     Coordination: Coordination normal.     Comments: CN 2-12 intact, no ataxia on finger to nose, no nystagmus, 5/5 strength throughout, no pronator drift, Romberg negative, normal gait. Equal sensation throughout  Psychiatric:        Behavior: Behavior normal.    ED Results / Procedures / Treatments   Labs (all labs ordered are listed, but only abnormal results are displayed) Labs Reviewed  BASIC METABOLIC PANEL - Abnormal; Notable for the following components:      Result Value   Glucose, Bld 107 (*)    Anion gap 4 (*)    All other components within normal limits  D-DIMER, QUANTITATIVE - Abnormal; Notable for the following components:   D-Dimer, Quant 1.15 (*)    All other components within normal limits  RESP PANEL BY RT-PCR (FLU A&B, COVID) ARPGX2  CBC  HCG, SERUM, QUALITATIVE  TROPONIN I (HIGH SENSITIVITY)  TROPONIN I (HIGH SENSITIVITY)    EKG EKG Interpretation  Date/Time:  Friday September 01 2020 23:04:50 EDT Ventricular Rate:  72 PR Interval:  140 QRS Duration: 88 QT Interval:  358 QTC Calculation: 392 R Axis:   71 Text Interpretation: Normal sinus rhythm Normal ECG  No significant change was found Confirmed by Glynn Octaveancour, Tab Rylee [phone]([id number]) on 09/02/2020 12:50:02 AM  Radiology DG Chest 2 View  Result Date: 09/01/2020 CLINICAL DATA:  Chest pain EXAM: CHEST - 2 VIEW COMPARISON:  08/05/2020 FINDINGS: Borderline cardiomegaly. No focal opacity or pleural effusion. No pneumothorax. IMPRESSION: No active cardiopulmonary disease. Electronically Signed   By: Jasmine PangKim  Fujinaga M.D.   On: 09/01/2020 23:33   CT HEAD WO CONTRAST (5MM)  Result Date: 09/02/2020 CLINICAL DATA:  Right arm and face  numbness and tingling EXAM: CT HEAD WITHOUT CONTRAST TECHNIQUE: Contiguous axial images were obtained from the base of the skull through the vertex without intravenous contrast. COMPARISON:  None. FINDINGS: Brain: No acute intracranial abnormality. Specifically, no hemorrhage, hydrocephalus, mass lesion, acute infarction, or significant intracranial injury. Vascular: No hyperdense vessel or unexpected calcification. Skull: No acute calvarial abnormality. Sinuses/Orbits: No acute findings Other: None IMPRESSION: Normal study. Electronically Signed   By: Charlett Nose M.D.   On: 09/02/2020 01:54   CT Angio Chest PE W and/or Wo Contrast  Result Date: 09/02/2020 CLINICAL DATA:  Right arm numbness, tingling, right facial numbness and tingling at rest, initially resolved then returned EXAM: CT ANGIOGRAPHY CHEST WITH CONTRAST TECHNIQUE: Multidetector CT imaging of the chest was performed using the standard protocol during bolus administration of intravenous contrast. Multiplanar CT image reconstructions and MIPs were obtained to evaluate the vascular anatomy. CONTRAST:  OMNIPAQUE IOHEXOL 350 MG/ML SOLN COMPARISON:  CT 04/03/2018 FINDINGS: Cardiovascular: Satisfactory opacification the pulmonary arteries to the segmental level. No pulmonary artery filling defects are identified. Central pulmonary arteries are normal caliber. Normal heart size. No pericardial effusion. The aortic root is suboptimally  assessed given cardiac pulsation artifact. The aorta is normal caliber. No acute luminal abnormality of the imaged aorta. No periaortic stranding or hemorrhage. Normal 3 vessel branching of the aortic arch. Proximal great vessels are unremarkable. Mediastinum/Nodes: Small amount of fluid in the pericardial recesses, likely within physiologic normal. No other free mediastinal fluid or gas. No acute abnormality of the trachea or esophagus. 11 mm hypoattenuating nodule in the left thyroid gland. Additional subcentimeter hypoattenuating nodules seen elsewhere within the gland. No worrisome mediastinal, hilar or axillary adenopathy. Lungs/Pleura: No consolidation, features of edema, pneumothorax, or effusion. No suspicious pulmonary nodules or masses. Upper Abdomen: Diffuse hepatic hypoattenuation, possibly contrast timing related or the result of hepatic steatosis. Prominent fold at the gallbladder fundus. Heterogeneous enhancement of the spleen, typical for arterial phase imaging. Musculoskeletal: No acute osseous abnormality or suspicious osseous lesion. Review of the MIP images confirms the above findings. IMPRESSION: No evidence of pulmonary artery embolism. 11 mm hypoattenuating nodule left lobe thyroid gland with additional tiny subcentimeter nodules. Recommend thyroid US (ref: J Am Coll Radiol. 2015 Feb;12(2): 143-50). No other acute or significant intrathoracic process. Diffuse hepatic hypoattenuation, may be contrast timing related or secondary to hepatic steatosis. Electronically Signed   By: Kreg Shropshire M.D.   On: 09/02/2020 03:01    Procedures Procedures   Medications Ordered in ED Medications - No data to display  ED Course  I have reviewed the triage vital signs and the nursing notes.  Pertinent labs & imaging results that were available during my care of the patient were reviewed by me and considered in my medical decision making (see chart for details).    MDM Rules/Calculators/A&P                           Episode of chest tightness with right arm numbness and tingling at 12 PM.  This resolved but then recurred again about 9 PM with arm numbness, chest tightness and right facial numbness.  Symptoms now resolved.  Low suspicion for ACS, PE or dissection.  Code stroke not activated due to resolution of symptoms  Discussed with Dr. Amada Jupiter of neurology.  He recommends MRI as well as MRA head and neck. He feels that this is negative she can be discharged home  Her chest pain is atypical  for ACS.  Troponin negative x2.  CT scan shows no pulmonary embolism but does show thyroid nodule which she is informed of.  MRI is not available.  Transfer arranged to [facility] for MRI and MRA per neurology recommendations.  Anticipate she will be able to go home with outpatient follow-up with these results are reassuring. Discussed with Dr. Eudelia Bunch. Final Clinical Impression(s) / ED Diagnoses Final diagnoses:  Right sided numbness  Atypical chest pain    Rx / DC Orders ED Discharge Orders     None        Cem Kosman, Jeannett Senior, MD 09/02/20 [phone]

## 2023-05-09 ENCOUNTER — Emergency Department (HOSPITAL_BASED_OUTPATIENT_CLINIC_OR_DEPARTMENT_OTHER)
Admission: EM | Admit: 2023-05-09 | Discharge: 2023-05-09 | Disposition: A | Discharge status: Home / Self Care | Attending: Emergency Medicine | Admitting: Emergency Medicine

## 2023-05-09 ENCOUNTER — Other Ambulatory Visit: Payer: Self-pay

## 2023-05-09 ENCOUNTER — Emergency Department (HOSPITAL_BASED_OUTPATIENT_CLINIC_OR_DEPARTMENT_OTHER)

## 2023-05-09 ENCOUNTER — Encounter (HOSPITAL_BASED_OUTPATIENT_CLINIC_OR_DEPARTMENT_OTHER): Payer: Self-pay

## 2023-05-09 DIAGNOSIS — Z7901 Long term (current) use of anticoagulants: Secondary | ICD-10-CM | POA: Insufficient documentation

## 2023-05-09 DIAGNOSIS — M79604 Pain in right leg: Secondary | ICD-10-CM | POA: Diagnosis present

## 2023-05-09 DIAGNOSIS — Z7982 Long term (current) use of aspirin: Secondary | ICD-10-CM | POA: Insufficient documentation

## 2023-05-09 NOTE — ED Provider Notes (Addendum)
 [facility] EM[facility] AT [facility] Provider Note   CSN: [id number] Arrival date & time: 05/09/23  1713     History  Chief Complaint  Patient presents with   Leg Pain    Jenna Hickman is a 38 y.o. female.  Patient here with concerns for discomfort in the right leg calf and thigh.  Patient has a history of a blood clot in her leg in 2020.  And also some question that maybe there was pulmonary embolism at that time as well.  Patient was seen by hematology oncology.  Patient was started on Eliquis.  Patient is no longer taking a blood thinner.  Patient denies any chest pain or shortness of breath here today.  Past medical history here is noncontributory.  Records shows that she saw hematology oncology at [facility].       Home Medications Prior to Admission medications   Medication Sig Start Date End Date Taking? Authorizing Provider  acetaminophen (TYLENOL) 500 MG tablet Take 1,000 mg by mouth every 6 (six) hours as needed for moderate pain.    [provider]  aspirin EC 81 MG tablet Take 1 tablet (81 mg total) by mouth daily. Swallow whole. 09/02/20   Curatolo, Adam, DO  famotidine (PEPCID) 20 MG tablet Take 1 tablet (20 mg total) by mouth 2 (two) times daily. 08/05/20 10/04/20  Gailen Shelter, PA  omeprazole (PRILOSEC) 20 MG capsule Take 1 capsule (20 mg total) by mouth daily. Patient not taking: No sig reported 04/12/20   Farrel Gordon, PA-C      Allergies    No known allergies and Rice    Review of Systems   Review of Systems  Constitutional:  Negative for chills and fever.  HENT:  Negative for ear pain and sore throat.   Eyes:  Negative for pain and visual disturbance.  Respiratory:  Negative for cough and shortness of breath.   Cardiovascular:  Positive for leg swelling. Negative for chest pain and palpitations.  Gastrointestinal:  Negative for abdominal pain and vomiting.  Genitourinary:  Negative for dysuria and hematuria.   Musculoskeletal:  Negative for arthralgias and back pain.  Skin:  Negative for color change and rash.  Neurological:  Negative for seizures and syncope.  All other systems reviewed and are negative.   Physical Exam Updated Vital Signs BP (!) 166/102 (BP Location: Left Arm)   Pulse 87   Temp 98.3 F (36.8 C) (Oral)   Resp 20   Ht 1.702 m (5\' 7" )   Wt 122.9 kg   LMP 05/02/2023 (Exact Date)   SpO2 100%   BMI 42.44 kg/m  Physical Exam Vitals and nursing note reviewed.  Constitutional:      General: She is not in acute distress.    Appearance: Normal appearance. She is well-developed.  HENT:     Head: Normocephalic and atraumatic.  Eyes:     Extraocular Movements: Extraocular movements intact.     Conjunctiva/sclera: Conjunctivae normal.     Pupils: Pupils are equal, round, and reactive to light.  Cardiovascular:     Rate and Rhythm: Normal rate and regular rhythm.     Heart sounds: No murmur heard. Pulmonary:     Effort: Pulmonary effort is normal. No respiratory distress.     Breath sounds: Normal breath sounds.  Abdominal:     Palpations: Abdomen is soft.     Tenderness: There is no abdominal tenderness.  Musculoskeletal:  General: No swelling.     Cervical back: Normal range of motion and neck supple.     Right lower leg: No edema.     Left lower leg: No edema.     Comments: Lamination of right lower extremity left lower extremity really without any obvious physical findings.  Dorsalis pedis pulse is 1+ both feet.  No significant swelling.  No erythema.  Skin:    General: Skin is warm and dry.     Capillary Refill: Capillary refill takes less than 2 seconds.  Neurological:     General: No focal deficit present.     Mental Status: She is alert and oriented to person, place, and time.  Psychiatric:        Mood and Affect: Mood normal.     ED Results / Procedures / Treatments   Labs (all labs ordered are listed, but only abnormal results are  displayed) Labs Reviewed - No data to display  EKG None  Radiology No results found.  Procedures Procedures    Medications Ordered in ED Medications - No data to display  ED Course/ Medical Decision Making/ A&P                                 Medical Decision Making  Patient without any cardiopulmonary symptoms.  Oxygen saturation on room air is 100% pulse pulse was 87.  Will get Doppler studies of the right lower extremity.  Doppler study negative for DVT.  Patient stable for discharge home.   Final Clinical Impression(s) / ED Diagnoses Final diagnoses:  Right leg pain    Rx / DC Orders ED Discharge Orders     None         Vanetta Mulders, MD 05/09/23 Salley Scarlet, MD 05/09/23 [id number]    Vanetta Mulders, MD 05/09/23 2015

## 2023-05-09 NOTE — ED Triage Notes (Signed)
 Pt with warm burning feeling in right leg, in calf and thigh. Pt states she does have a history of blood clot in 2020, pt had chest pain earlier this week, but thinks it was reflux, resolved with Pepcid. Denies current CP, SOB. Pt nonsmoker not on birth control.   Robina Ade, RN

## 2023-05-09 NOTE — Discharge Instructions (Signed)
 The Doppler study was negative for any blood clots in the legs.  Would recommend just symptomatic treatment with Motrin or extra strength Tylenol.  Can follow-up with your regular doctor.  Return for any new or worse symptoms

## 2024-02-06 ENCOUNTER — Emergency Department (HOSPITAL_BASED_OUTPATIENT_CLINIC_OR_DEPARTMENT_OTHER)

## 2024-02-06 ENCOUNTER — Emergency Department (HOSPITAL_BASED_OUTPATIENT_CLINIC_OR_DEPARTMENT_OTHER)
Admission: EM | Admit: 2024-02-06 | Discharge: 2024-02-06 | Disposition: A | Discharge status: Home / Self Care | Attending: Emergency Medicine | Admitting: Emergency Medicine

## 2024-02-06 ENCOUNTER — Other Ambulatory Visit: Payer: Self-pay

## 2024-02-06 ENCOUNTER — Encounter (HOSPITAL_BASED_OUTPATIENT_CLINIC_OR_DEPARTMENT_OTHER): Payer: Self-pay

## 2024-02-06 DIAGNOSIS — K219 Gastro-esophageal reflux disease without esophagitis: Secondary | ICD-10-CM | POA: Insufficient documentation

## 2024-02-06 DIAGNOSIS — Z7982 Long term (current) use of aspirin: Secondary | ICD-10-CM | POA: Diagnosis not present

## 2024-02-06 DIAGNOSIS — R0789 Other chest pain: Secondary | ICD-10-CM | POA: Diagnosis present

## 2024-02-06 LAB — BASIC METABOLIC PANEL WITH GFR
Anion gap: 10 (ref 5–15)
BUN: 10 mg/dL (ref 6–20)
CO2: 26 mmol/L (ref 22–32)
Calcium: 9.3 mg/dL (ref 8.9–10.3)
Chloride: 102 mmol/L (ref 98–111)
Creatinine, Ser: 0.79 mg/dL (ref 0.44–1.00)
GFR, Estimated: >60 mL/min
Glucose, Bld: 89 mg/dL (ref 70–99)
Potassium: 4.1 mmol/L (ref 3.5–5.1)
Sodium: 138 mmol/L (ref 135–145)

## 2024-02-06 LAB — CBC
HCT: 40.3 % (ref 36.0–46.0)
Hemoglobin: 13.3 g/dL (ref 12.0–15.0)
MCH: 29.2 pg (ref 26.0–34.0)
MCHC: 33 g/dL (ref 30.0–36.0)
MCV: 88.6 fL (ref 80.0–100.0)
Platelets: 239 K/uL (ref 150–400)
RBC: 4.55 MIL/uL (ref 3.87–5.11)
RDW: 12.6 % (ref 11.5–15.5)
WBC: 4.5 K/uL (ref 4.0–10.5)
nRBC: 0 % (ref 0.0–0.2)

## 2024-02-06 LAB — D-DIMER, QUANTITATIVE: D-Dimer, Quant: 1.36 ug{FEU}/mL — ABNORMAL HIGH (ref 0.00–0.50)

## 2024-02-06 LAB — PREGNANCY, URINE: Preg Test, Ur: NEGATIVE

## 2024-02-06 LAB — TROPONIN T, HIGH SENSITIVITY
Troponin T High Sensitivity: <15 ng/L (ref 0–19)
Troponin T High Sensitivity: <15 ng/L (ref 0–19)

## 2024-02-06 MED ORDER — ALUM & MAG HYDROXIDE-SIMETH 200-200-20 MG/5ML PO SUSP
30.0000 mL | Freq: Once | ORAL | Status: AC
  Administered 2024-02-06: 30 mL via ORAL
  Filled 2024-02-06: qty 30

## 2024-02-06 MED ORDER — ACETAMINOPHEN 500 MG PO TABS
1000.0000 mg | ORAL_TABLET | Freq: Once | ORAL | Status: AC
  Administered 2024-02-06: 1000 mg via ORAL
  Filled 2024-02-06: qty 2

## 2024-02-06 MED ORDER — LIDOCAINE VISCOUS HCL 2 % MT SOLN
15.0000 mL | Freq: Once | OROMUCOSAL | Status: AC
  Administered 2024-02-06: 15 mL via ORAL
  Filled 2024-02-06: qty 15

## 2024-02-06 MED ORDER — IOHEXOL 350 MG/ML SOLN
50.0000 mL | Freq: Once | INTRAVENOUS | Status: AC | PRN
  Administered 2024-02-06: 75 mL via INTRAVENOUS

## 2024-02-06 NOTE — ED Triage Notes (Signed)
 Pt reports central chest tightness since yesterday. No SHOB.

## 2024-02-06 NOTE — ED Provider Notes (Signed)
 " [facility] Provider Note   CSN: [id number] Arrival date & time: 02/06/24  1639     Patient presents with: Chest Pain   Jenna Hickman is a 39 y.o. female.  Patient has history significant for PE, DVT, GERD, obesity presents to the emergency department concerns of chest tightness.  Reports that she been having central chest tightness without radiation since yesterday.  Denies any significant shortness of breath.  She states that she is not current on any blood thinners and last PE was 6 years ago.  Reports this sensation feels different than her typical acid reflux symptoms and she believes this is likely the cause of her discomfort but is unsure in what is the reason that she is here for evaluation.  Denies any fever, cough, congestion, sore throat.  No sick contacts.   Chest Pain      Prior to Admission medications  Medication Sig Start Date End Date Taking? Authorizing Provider  acetaminophen  (TYLENOL ) 500 MG tablet Take 1,000 mg by mouth every 6 (six) hours as needed for moderate pain.    [provider]  aspirin  EC 81 MG tablet Take 1 tablet (81 mg total) by mouth daily. Swallow whole. 09/02/20   Curatolo, Adam, DO  famotidine  (PEPCID ) 20 MG tablet Take 1 tablet (20 mg total) by mouth 2 (two) times daily. 08/05/20 10/04/20  Neldon Hamp RAMAN, PA  omeprazole  (PRILOSEC) 20 MG capsule Take 1 capsule (20 mg total) by mouth daily. Patient not taking: No sig reported 04/12/20   Tobie Schatz, PA-C    Allergies: No known allergies and Rice    Review of Systems  Cardiovascular:  Positive for chest pain.  All other systems reviewed and are negative.   Updated Vital Signs BP (!) 134/96 (BP Location: Right Arm)   Pulse 83   Temp 98.3 F (36.8 C) (Oral)   Resp 17   Ht 5' 8 (1.727 m)   Wt 121.1 kg   LMP 12/25/2023   SpO2 100%   BMI 40.60 kg/m   Physical Exam Vitals and nursing note reviewed.  Constitutional:      General:  She is not in acute distress.    Appearance: She is well-developed.  HENT:     Head: Normocephalic and atraumatic.  Eyes:     Conjunctiva/sclera: Conjunctivae normal.  Cardiovascular:     Rate and Rhythm: Normal rate and regular rhythm.     Heart sounds: Normal heart sounds. No murmur heard. Pulmonary:     Effort: Pulmonary effort is normal. No respiratory distress.     Breath sounds: Normal breath sounds. No decreased breath sounds, wheezing or rhonchi.  Chest:     Chest wall: No tenderness.  Abdominal:     Palpations: Abdomen is soft.     Tenderness: There is no abdominal tenderness.  Musculoskeletal:        General: No swelling.     Cervical back: Neck supple.  Skin:    General: Skin is warm and dry.     Capillary Refill: Capillary refill takes less than 2 seconds.  Neurological:     Mental Status: She is alert.  Psychiatric:        Mood and Affect: Mood normal.     (all labs ordered are listed, but only abnormal results are displayed) Labs Reviewed  D-DIMER, QUANTITATIVE - Abnormal; Notable for the following components:      Result Value   D-Dimer, Quant 1.36 (*)  All other components within normal limits  BASIC METABOLIC PANEL WITH GFR  CBC  PREGNANCY, URINE  TROPONIN T, HIGH SENSITIVITY  TROPONIN T, HIGH SENSITIVITY    EKG: EKG Interpretation Date/Time:  Friday February 06 2024 16:57:59 EST Ventricular Rate:  72 PR Interval:  130 QRS Duration:  88 QT Interval:  374 QTC Calculation: 410 R Axis:   58  Text Interpretation: Sinus rhythm Confirmed by Franklyn Gills [phone]) on 02/06/2024 8:41:55 PM  Radiology: CT Angio Chest PE W and/or Wo Contrast Result Date: 02/06/2024 EXAM: CTA of the Chest with contrast for PE 02/06/2024 08:19:48 PM TECHNIQUE: CTA of the chest was performed without and with the administration of 75 mL of intravenous contrast (iohexol  (OMNIPAQUE ) 350 MG/ML injection 50 mL IOHEXOL  350 MG/ML SOLN). Multiplanar reformatted images are provided  for review. MIP images are provided for review. Automated exposure control, iterative reconstruction, and/or weight based adjustment of the mA/kV was utilized to reduce the radiation dose to as low as reasonably achievable. COMPARISON: CT angio chest 09/02/2020. CLINICAL HISTORY: Pulmonary embolism (PE) suspected, low to intermediate prob, positive D-dimer. FINDINGS: PULMONARY ARTERIES: Pulmonary arteries are adequately opacified for evaluation. No pulmonary embolus. Main pulmonary artery is normal in caliber. MEDIASTINUM: The heart and pericardium demonstrate no acute abnormality. The coronary artery is normal in caliber. The thoracic aorta is normal in caliber. The thyroid gland is unremarkable. Esophagus is unremarkable. LYMPH NODES: No mediastinal, hilar or axillary lymphadenopathy. LUNGS AND PLEURA: The lungs are without acute process. No focal consolidation or pulmonary edema. No pleural effusion or pneumothorax. UPPER ABDOMEN: Limited images of the upper abdomen are unremarkable. SOFT TISSUES AND BONES: No acute bone or soft tissue abnormality. IMPRESSION: 1. No pulmonary embolism. Electronically signed by: Morgane Naveau MD MD 02/06/2024 08:25 PM EST RP Workstation: [id number]   DG Chest 2 View Result Date: 02/06/2024 EXAM: 2 VIEW(S) XRAY OF THE CHEST 02/06/2024 06:01:00 PM COMPARISON: None available. CLINICAL HISTORY: CP FINDINGS: LUNGS AND PLEURA: No focal pulmonary opacity. No pleural effusion. No pneumothorax. HEART AND MEDIASTINUM: Mild cardiomegaly. BONES AND SOFT TISSUES: No acute osseous abnormality. IMPRESSION: 1. No acute findings. 2. Mild cardiomegaly. Electronically signed by: Greig Pique MD MD 02/06/2024 06:32 PM EST RP Workstation: [id number]     Procedures   Medications Ordered in the ED  iohexol  (OMNIPAQUE ) 350 MG/ML injection 50 mL (75 mLs Intravenous Contrast Given 02/06/24 1845)  acetaminophen  (TYLENOL ) tablet 1,000 mg (1,000 mg Oral Given 02/06/24 2118)  alum & mag  hydroxide-simeth (MAALOX/MYLANTA) 200-200-20 MG/5ML suspension 30 mL (30 mLs Oral Given 02/06/24 2119)    And  lidocaine  (XYLOCAINE ) 2 % viscous mouth solution 15 mL (15 mLs Oral Given 02/06/24 2119)                                    Medical Decision Making Amount and/or Complexity of Data Reviewed Labs: ordered. Radiology: ordered.  Risk OTC drugs. Prescription drug management.   This patient presents to the ED for concern of chest pain, this involves an extensive number of treatment options, and is a complaint that carries with it a high risk of complications and morbidity.  The differential diagnosis includes ACS, PE, pneumonia, bronchitis, pericarditis   Co morbidities that complicate the patient evaluation  History of PE/DVT, GERD, obesity   Additional history obtained:  Additional history obtained from chart review   Lab Tests:  I Ordered, and personally interpreted labs.  The pertinent  results include: CBC unremarkable, BMP unremarkable, troponin negative at less than 15, D-dimer elevated at 1.36, urine pregnancy negative   Imaging Studies ordered:  I ordered imaging studies including chest x-ray, CT angio chest I independently visualized and interpreted imaging which showed chest x-ray and CT angio chest negative for any acute cardiopulmonary process.  No evidence of DVT. I agree with the radiologist interpretation   Cardiac Monitoring: / EKG:  The patient was maintained on a cardiac monitor.  I personally viewed and interpreted the cardiac monitored which showed an underlying rhythm of: Sinus rhythm   Consultations Obtained:  I requested consultation with none,  and discussed lab and imaging findings as well as pertinent plan - they recommend: N/A   Problem List / ED Course / Critical interventions / Medication management  Patient with past history significant for PE, DVT, GERD, obesity presents ED with concerns of chest pain and tightness.  Reports that  started yesterday with central chest tightness without radiation.  She denies any significant shortness of breath.  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  She states that at that time, she followed up with pulmonology closely but was never able to clearly figure out why she developed a PE.  No fever, chills or bodyaches.  Denies any sick contacts. Physical exam reveals trace edema to the bilateral lower extremities.  No chest wall tenderness.  No abnormal heart or lung sounds.  Otherwise reassuring physical exam and normal vitals with exception of mild hypertension.  With her history of PE, will obtain basic labs, troponin, and D-dimer for further evaluation.  She is not overtly tachycardic, hypoxic, or tachypneic at this time. Workup today is reassuring with normal CBC, BMP, and troponin is negative.  D-dimer however is elevated 1.36.  Will obtain CT angio chest.  Urine pregnancy negative. Chest x-ray and CT angio chest negative for any acute findings such as bronchitis, pneumonia, PE, or other cause of her symptoms.  Will trial a course of a GI cocktail and reassess shortly. On reevaluation, she reports improvement of her symptoms.  Do suspect this is likely GERD related.  Advised that she is stable for outpatient follow-up but should initiate a PPI for the next 2 weeks and follow-up closely with her primary care provider.  Return precautions advised.  Otherwise stable for outpatient follow-up and discharged home. I ordered medication including Tylenol , Maalox/viscous lidocaine  for GERD Reevaluation of the patient after these medicines showed that the patient improved I have reviewed the patients home medicines and have made adjustments as needed   Social Determinants of Health:  None   Test / Admission - Considered:  Considered but stable for outpatient follow-up.  Final diagnoses:  Other  chest pain  Gastroesophageal reflux disease, unspecified whether esophagitis present    ED Discharge Orders     None          Cecily Legrand LABOR, PA-C 02/06/24 2156    Franklyn Sid SAILOR, MD 02/06/24 2226  "

## 2024-02-06 NOTE — Discharge Instructions (Addendum)
 You were seen in the emergency department today for concerns of chest pain.  Your labs and imaging were thankfully reassuring without any obvious abnormality seen such as pneumonia, bronchitis, pulmonary embolism.  I do suspect your symptoms are likely due to acid reflux as he had improvement with acid reducing medications here in the emergency department.  You should make sure you are taking something like omeprazole  to help manage the symptoms along with the Pepcid  and the Mylanta that you have been trying to use.  For any concerns of new or worsening discomfort, return to the emergency department.  Otherwise, follow-up with your primary care provider.
